# Patient Record
Sex: Female | Born: 1955 | Race: Black or African American | Hispanic: No | Marital: Married | Smoking: Never smoker
Health system: Southern US, Community
[De-identification: ages and names within clinical notes are randomized; demographics above are authoritative.]

## PROBLEM LIST (undated history)

## (undated) HISTORY — PX: FOOT SURGERY: SHX648

## (undated) HISTORY — PX: CARPAL TUNNEL RELEASE: SHX101

## (undated) HISTORY — PX: SHOULDER SURGERY: SHX246

---

## 2013-04-08 ENCOUNTER — Other Ambulatory Visit (HOSPITAL_COMMUNITY)
Admission: RE | Admit: 2013-04-08 | Discharge: 2013-04-08 | Disposition: A | Discharge status: Home / Self Care | Payer: Medicare PPO | Source: Ambulatory Visit | Attending: Internal Medicine | Admitting: Internal Medicine

## 2013-04-08 DIAGNOSIS — Z124 Encounter for screening for malignant neoplasm of cervix: Secondary | ICD-10-CM | POA: Insufficient documentation

## 2013-04-08 DIAGNOSIS — Z1151 Encounter for screening for human papillomavirus (HPV): Secondary | ICD-10-CM | POA: Insufficient documentation

## 2013-04-12 ENCOUNTER — Other Ambulatory Visit: Payer: Self-pay

## 2013-04-12 DIAGNOSIS — Z1231 Encounter for screening mammogram for malignant neoplasm of breast: Secondary | ICD-10-CM

## 2013-04-22 ENCOUNTER — Ambulatory Visit: Payer: Self-pay

## 2013-04-28 ENCOUNTER — Ambulatory Visit
Admission: RE | Admit: 2013-04-28 | Discharge: 2013-04-28 | Disposition: A | Discharge status: Home / Self Care | Payer: Medicare PPO | Source: Ambulatory Visit

## 2013-04-28 DIAGNOSIS — Z1231 Encounter for screening mammogram for malignant neoplasm of breast: Secondary | ICD-10-CM

## 2015-03-20 DIAGNOSIS — R011 Cardiac murmur, unspecified: Secondary | ICD-10-CM | POA: Diagnosis not present

## 2015-04-17 DIAGNOSIS — Z1389 Encounter for screening for other disorder: Secondary | ICD-10-CM | POA: Diagnosis not present

## 2015-04-17 DIAGNOSIS — E785 Hyperlipidemia, unspecified: Secondary | ICD-10-CM | POA: Diagnosis not present

## 2015-04-17 DIAGNOSIS — Z0000 Encounter for general adult medical examination without abnormal findings: Secondary | ICD-10-CM | POA: Diagnosis not present

## 2015-04-17 DIAGNOSIS — E890 Postprocedural hypothyroidism: Secondary | ICD-10-CM | POA: Diagnosis not present

## 2015-04-17 DIAGNOSIS — G5601 Carpal tunnel syndrome, right upper limb: Secondary | ICD-10-CM | POA: Diagnosis not present

## 2015-04-17 DIAGNOSIS — Z7984 Long term (current) use of oral hypoglycemic drugs: Secondary | ICD-10-CM | POA: Diagnosis not present

## 2015-04-17 DIAGNOSIS — I6930 Unspecified sequelae of cerebral infarction: Secondary | ICD-10-CM | POA: Diagnosis not present

## 2015-04-17 DIAGNOSIS — E113299 Type 2 diabetes mellitus with mild nonproliferative diabetic retinopathy without macular edema, unspecified eye: Secondary | ICD-10-CM | POA: Diagnosis not present

## 2015-04-17 DIAGNOSIS — D509 Iron deficiency anemia, unspecified: Secondary | ICD-10-CM | POA: Diagnosis not present

## 2015-08-11 DIAGNOSIS — E119 Type 2 diabetes mellitus without complications: Secondary | ICD-10-CM | POA: Diagnosis not present

## 2015-08-11 DIAGNOSIS — Z7984 Long term (current) use of oral hypoglycemic drugs: Secondary | ICD-10-CM | POA: Diagnosis not present

## 2015-08-11 DIAGNOSIS — H52223 Regular astigmatism, bilateral: Secondary | ICD-10-CM | POA: Diagnosis not present

## 2015-08-11 DIAGNOSIS — H5213 Myopia, bilateral: Secondary | ICD-10-CM | POA: Diagnosis not present

## 2015-08-11 DIAGNOSIS — H524 Presbyopia: Secondary | ICD-10-CM | POA: Diagnosis not present

## 2015-08-31 ENCOUNTER — Encounter (INDEPENDENT_AMBULATORY_CARE_PROVIDER_SITE_OTHER): Payer: Commercial Managed Care - HMO | Admitting: Ophthalmology

## 2015-09-21 ENCOUNTER — Encounter (INDEPENDENT_AMBULATORY_CARE_PROVIDER_SITE_OTHER): Payer: Commercial Managed Care - HMO | Admitting: Ophthalmology

## 2015-10-12 ENCOUNTER — Encounter (INDEPENDENT_AMBULATORY_CARE_PROVIDER_SITE_OTHER): Payer: Commercial Managed Care - HMO | Admitting: Ophthalmology

## 2015-11-15 ENCOUNTER — Encounter (INDEPENDENT_AMBULATORY_CARE_PROVIDER_SITE_OTHER): Payer: Commercial Managed Care - HMO | Admitting: Ophthalmology

## 2015-11-15 DIAGNOSIS — H43813 Vitreous degeneration, bilateral: Secondary | ICD-10-CM | POA: Diagnosis not present

## 2015-11-15 DIAGNOSIS — H2513 Age-related nuclear cataract, bilateral: Secondary | ICD-10-CM | POA: Diagnosis not present

## 2015-11-15 DIAGNOSIS — E11311 Type 2 diabetes mellitus with unspecified diabetic retinopathy with macular edema: Secondary | ICD-10-CM

## 2015-11-15 DIAGNOSIS — E113313 Type 2 diabetes mellitus with moderate nonproliferative diabetic retinopathy with macular edema, bilateral: Secondary | ICD-10-CM | POA: Diagnosis not present

## 2015-12-11 ENCOUNTER — Encounter (INDEPENDENT_AMBULATORY_CARE_PROVIDER_SITE_OTHER): Payer: Commercial Managed Care - HMO | Admitting: Ophthalmology

## 2015-12-11 DIAGNOSIS — E11311 Type 2 diabetes mellitus with unspecified diabetic retinopathy with macular edema: Secondary | ICD-10-CM

## 2015-12-11 DIAGNOSIS — E113313 Type 2 diabetes mellitus with moderate nonproliferative diabetic retinopathy with macular edema, bilateral: Secondary | ICD-10-CM | POA: Diagnosis not present

## 2015-12-11 DIAGNOSIS — H43813 Vitreous degeneration, bilateral: Secondary | ICD-10-CM | POA: Diagnosis not present

## 2015-12-13 ENCOUNTER — Encounter (INDEPENDENT_AMBULATORY_CARE_PROVIDER_SITE_OTHER): Payer: Commercial Managed Care - HMO | Admitting: Ophthalmology

## 2015-12-27 DIAGNOSIS — Z23 Encounter for immunization: Secondary | ICD-10-CM | POA: Diagnosis not present

## 2015-12-27 DIAGNOSIS — E1142 Type 2 diabetes mellitus with diabetic polyneuropathy: Secondary | ICD-10-CM | POA: Diagnosis not present

## 2015-12-27 DIAGNOSIS — Z794 Long term (current) use of insulin: Secondary | ICD-10-CM | POA: Diagnosis not present

## 2015-12-27 DIAGNOSIS — G5601 Carpal tunnel syndrome, right upper limb: Secondary | ICD-10-CM | POA: Diagnosis not present

## 2015-12-27 DIAGNOSIS — E113299 Type 2 diabetes mellitus with mild nonproliferative diabetic retinopathy without macular edema, unspecified eye: Secondary | ICD-10-CM | POA: Diagnosis not present

## 2015-12-27 DIAGNOSIS — E785 Hyperlipidemia, unspecified: Secondary | ICD-10-CM | POA: Diagnosis not present

## 2015-12-27 DIAGNOSIS — E1165 Type 2 diabetes mellitus with hyperglycemia: Secondary | ICD-10-CM | POA: Diagnosis not present

## 2015-12-27 DIAGNOSIS — I6930 Unspecified sequelae of cerebral infarction: Secondary | ICD-10-CM | POA: Diagnosis not present

## 2016-01-08 ENCOUNTER — Encounter (INDEPENDENT_AMBULATORY_CARE_PROVIDER_SITE_OTHER): Payer: Commercial Managed Care - HMO | Admitting: Ophthalmology

## 2016-01-08 DIAGNOSIS — E11311 Type 2 diabetes mellitus with unspecified diabetic retinopathy with macular edema: Secondary | ICD-10-CM | POA: Diagnosis not present

## 2016-01-08 DIAGNOSIS — H2513 Age-related nuclear cataract, bilateral: Secondary | ICD-10-CM

## 2016-01-08 DIAGNOSIS — E113313 Type 2 diabetes mellitus with moderate nonproliferative diabetic retinopathy with macular edema, bilateral: Secondary | ICD-10-CM | POA: Diagnosis not present

## 2016-01-08 DIAGNOSIS — H43813 Vitreous degeneration, bilateral: Secondary | ICD-10-CM | POA: Diagnosis not present

## 2016-01-25 ENCOUNTER — Other Ambulatory Visit: Payer: Self-pay | Admitting: Internal Medicine

## 2016-01-25 DIAGNOSIS — Z1231 Encounter for screening mammogram for malignant neoplasm of breast: Secondary | ICD-10-CM

## 2016-02-01 ENCOUNTER — Ambulatory Visit
Admission: RE | Admit: 2016-02-01 | Discharge: 2016-02-01 | Disposition: A | Discharge status: Home / Self Care | Payer: Medicare PPO | Source: Ambulatory Visit | Attending: Internal Medicine | Admitting: Internal Medicine

## 2016-02-01 DIAGNOSIS — Z1231 Encounter for screening mammogram for malignant neoplasm of breast: Secondary | ICD-10-CM

## 2016-02-05 ENCOUNTER — Encounter (INDEPENDENT_AMBULATORY_CARE_PROVIDER_SITE_OTHER): Payer: Commercial Managed Care - HMO | Admitting: Ophthalmology

## 2016-02-05 DIAGNOSIS — H43813 Vitreous degeneration, bilateral: Secondary | ICD-10-CM | POA: Diagnosis not present

## 2016-02-05 DIAGNOSIS — H2513 Age-related nuclear cataract, bilateral: Secondary | ICD-10-CM

## 2016-02-05 DIAGNOSIS — E11311 Type 2 diabetes mellitus with unspecified diabetic retinopathy with macular edema: Secondary | ICD-10-CM | POA: Diagnosis not present

## 2016-02-05 DIAGNOSIS — E113313 Type 2 diabetes mellitus with moderate nonproliferative diabetic retinopathy with macular edema, bilateral: Secondary | ICD-10-CM

## 2016-03-04 ENCOUNTER — Encounter (INDEPENDENT_AMBULATORY_CARE_PROVIDER_SITE_OTHER): Payer: Medicare HMO | Admitting: Ophthalmology

## 2016-03-04 DIAGNOSIS — E113313 Type 2 diabetes mellitus with moderate nonproliferative diabetic retinopathy with macular edema, bilateral: Secondary | ICD-10-CM | POA: Diagnosis not present

## 2016-03-04 DIAGNOSIS — E11311 Type 2 diabetes mellitus with unspecified diabetic retinopathy with macular edema: Secondary | ICD-10-CM

## 2016-03-04 DIAGNOSIS — H43813 Vitreous degeneration, bilateral: Secondary | ICD-10-CM

## 2016-03-04 DIAGNOSIS — H2513 Age-related nuclear cataract, bilateral: Secondary | ICD-10-CM | POA: Diagnosis not present

## 2016-04-01 ENCOUNTER — Encounter (INDEPENDENT_AMBULATORY_CARE_PROVIDER_SITE_OTHER): Payer: Medicare HMO | Admitting: Ophthalmology

## 2016-04-12 ENCOUNTER — Encounter (INDEPENDENT_AMBULATORY_CARE_PROVIDER_SITE_OTHER): Payer: Medicare HMO | Admitting: Ophthalmology

## 2016-04-12 DIAGNOSIS — E11311 Type 2 diabetes mellitus with unspecified diabetic retinopathy with macular edema: Secondary | ICD-10-CM

## 2016-04-12 DIAGNOSIS — E113313 Type 2 diabetes mellitus with moderate nonproliferative diabetic retinopathy with macular edema, bilateral: Secondary | ICD-10-CM

## 2016-04-12 DIAGNOSIS — H2513 Age-related nuclear cataract, bilateral: Secondary | ICD-10-CM | POA: Diagnosis not present

## 2016-04-12 DIAGNOSIS — H43813 Vitreous degeneration, bilateral: Secondary | ICD-10-CM

## 2016-04-23 DIAGNOSIS — E113299 Type 2 diabetes mellitus with mild nonproliferative diabetic retinopathy without macular edema, unspecified eye: Secondary | ICD-10-CM | POA: Diagnosis not present

## 2016-04-23 DIAGNOSIS — E1142 Type 2 diabetes mellitus with diabetic polyneuropathy: Secondary | ICD-10-CM | POA: Diagnosis not present

## 2016-04-23 DIAGNOSIS — Z01419 Encounter for gynecological examination (general) (routine) without abnormal findings: Secondary | ICD-10-CM | POA: Diagnosis not present

## 2016-04-23 DIAGNOSIS — E785 Hyperlipidemia, unspecified: Secondary | ICD-10-CM | POA: Diagnosis not present

## 2016-04-23 DIAGNOSIS — Z1231 Encounter for screening mammogram for malignant neoplasm of breast: Secondary | ICD-10-CM | POA: Diagnosis not present

## 2016-04-23 DIAGNOSIS — E890 Postprocedural hypothyroidism: Secondary | ICD-10-CM | POA: Diagnosis not present

## 2016-04-23 DIAGNOSIS — I6930 Unspecified sequelae of cerebral infarction: Secondary | ICD-10-CM | POA: Diagnosis not present

## 2016-04-23 DIAGNOSIS — G5601 Carpal tunnel syndrome, right upper limb: Secondary | ICD-10-CM | POA: Diagnosis not present

## 2016-04-23 DIAGNOSIS — Z0000 Encounter for general adult medical examination without abnormal findings: Secondary | ICD-10-CM | POA: Diagnosis not present

## 2016-04-23 DIAGNOSIS — Z7984 Long term (current) use of oral hypoglycemic drugs: Secondary | ICD-10-CM | POA: Diagnosis not present

## 2016-04-23 DIAGNOSIS — E611 Iron deficiency: Secondary | ICD-10-CM | POA: Diagnosis not present

## 2016-05-09 ENCOUNTER — Encounter (INDEPENDENT_AMBULATORY_CARE_PROVIDER_SITE_OTHER): Payer: Medicare HMO | Admitting: Ophthalmology

## 2016-05-09 DIAGNOSIS — H43813 Vitreous degeneration, bilateral: Secondary | ICD-10-CM | POA: Diagnosis not present

## 2016-05-09 DIAGNOSIS — E11311 Type 2 diabetes mellitus with unspecified diabetic retinopathy with macular edema: Secondary | ICD-10-CM

## 2016-05-09 DIAGNOSIS — H2513 Age-related nuclear cataract, bilateral: Secondary | ICD-10-CM | POA: Diagnosis not present

## 2016-05-09 DIAGNOSIS — E113313 Type 2 diabetes mellitus with moderate nonproliferative diabetic retinopathy with macular edema, bilateral: Secondary | ICD-10-CM

## 2016-06-06 ENCOUNTER — Encounter (INDEPENDENT_AMBULATORY_CARE_PROVIDER_SITE_OTHER): Payer: Medicare HMO | Admitting: Ophthalmology

## 2016-06-06 DIAGNOSIS — E11311 Type 2 diabetes mellitus with unspecified diabetic retinopathy with macular edema: Secondary | ICD-10-CM | POA: Diagnosis not present

## 2016-06-06 DIAGNOSIS — E113313 Type 2 diabetes mellitus with moderate nonproliferative diabetic retinopathy with macular edema, bilateral: Secondary | ICD-10-CM | POA: Diagnosis not present

## 2016-06-06 DIAGNOSIS — H43813 Vitreous degeneration, bilateral: Secondary | ICD-10-CM

## 2016-07-02 ENCOUNTER — Encounter (INDEPENDENT_AMBULATORY_CARE_PROVIDER_SITE_OTHER): Payer: Medicare HMO | Admitting: Ophthalmology

## 2016-07-02 DIAGNOSIS — H2513 Age-related nuclear cataract, bilateral: Secondary | ICD-10-CM

## 2016-07-02 DIAGNOSIS — E11311 Type 2 diabetes mellitus with unspecified diabetic retinopathy with macular edema: Secondary | ICD-10-CM | POA: Diagnosis not present

## 2016-07-02 DIAGNOSIS — H43813 Vitreous degeneration, bilateral: Secondary | ICD-10-CM | POA: Diagnosis not present

## 2016-07-02 DIAGNOSIS — E113313 Type 2 diabetes mellitus with moderate nonproliferative diabetic retinopathy with macular edema, bilateral: Secondary | ICD-10-CM | POA: Diagnosis not present

## 2016-07-18 DIAGNOSIS — Z7984 Long term (current) use of oral hypoglycemic drugs: Secondary | ICD-10-CM | POA: Diagnosis not present

## 2016-07-18 DIAGNOSIS — E1165 Type 2 diabetes mellitus with hyperglycemia: Secondary | ICD-10-CM | POA: Diagnosis not present

## 2016-07-18 DIAGNOSIS — E113299 Type 2 diabetes mellitus with mild nonproliferative diabetic retinopathy without macular edema, unspecified eye: Secondary | ICD-10-CM | POA: Diagnosis not present

## 2016-07-23 ENCOUNTER — Other Ambulatory Visit (INDEPENDENT_AMBULATORY_CARE_PROVIDER_SITE_OTHER): Payer: Medicare HMO | Admitting: Ophthalmology

## 2016-07-23 DIAGNOSIS — E11311 Type 2 diabetes mellitus with unspecified diabetic retinopathy with macular edema: Secondary | ICD-10-CM

## 2016-07-23 DIAGNOSIS — E113311 Type 2 diabetes mellitus with moderate nonproliferative diabetic retinopathy with macular edema, right eye: Secondary | ICD-10-CM | POA: Diagnosis not present

## 2016-07-30 ENCOUNTER — Encounter (INDEPENDENT_AMBULATORY_CARE_PROVIDER_SITE_OTHER): Payer: Medicare HMO | Admitting: Ophthalmology

## 2016-07-30 DIAGNOSIS — E113313 Type 2 diabetes mellitus with moderate nonproliferative diabetic retinopathy with macular edema, bilateral: Secondary | ICD-10-CM | POA: Diagnosis not present

## 2016-07-30 DIAGNOSIS — H43813 Vitreous degeneration, bilateral: Secondary | ICD-10-CM

## 2016-07-30 DIAGNOSIS — E11311 Type 2 diabetes mellitus with unspecified diabetic retinopathy with macular edema: Secondary | ICD-10-CM | POA: Diagnosis not present

## 2016-07-30 DIAGNOSIS — H2513 Age-related nuclear cataract, bilateral: Secondary | ICD-10-CM

## 2016-08-09 ENCOUNTER — Other Ambulatory Visit (INDEPENDENT_AMBULATORY_CARE_PROVIDER_SITE_OTHER): Payer: Medicare HMO | Admitting: Ophthalmology

## 2016-08-09 DIAGNOSIS — E11311 Type 2 diabetes mellitus with unspecified diabetic retinopathy with macular edema: Secondary | ICD-10-CM | POA: Diagnosis not present

## 2016-08-09 DIAGNOSIS — E113312 Type 2 diabetes mellitus with moderate nonproliferative diabetic retinopathy with macular edema, left eye: Secondary | ICD-10-CM

## 2016-08-27 ENCOUNTER — Encounter (INDEPENDENT_AMBULATORY_CARE_PROVIDER_SITE_OTHER): Payer: Medicare HMO | Admitting: Ophthalmology

## 2016-09-05 ENCOUNTER — Encounter (INDEPENDENT_AMBULATORY_CARE_PROVIDER_SITE_OTHER): Payer: Medicare HMO | Admitting: Ophthalmology

## 2016-09-05 DIAGNOSIS — H43813 Vitreous degeneration, bilateral: Secondary | ICD-10-CM | POA: Diagnosis not present

## 2016-09-05 DIAGNOSIS — E11311 Type 2 diabetes mellitus with unspecified diabetic retinopathy with macular edema: Secondary | ICD-10-CM

## 2016-09-05 DIAGNOSIS — E113313 Type 2 diabetes mellitus with moderate nonproliferative diabetic retinopathy with macular edema, bilateral: Secondary | ICD-10-CM | POA: Diagnosis not present

## 2016-10-03 ENCOUNTER — Encounter (INDEPENDENT_AMBULATORY_CARE_PROVIDER_SITE_OTHER): Payer: Medicare HMO | Admitting: Ophthalmology

## 2016-10-03 DIAGNOSIS — H43813 Vitreous degeneration, bilateral: Secondary | ICD-10-CM

## 2016-10-03 DIAGNOSIS — E11311 Type 2 diabetes mellitus with unspecified diabetic retinopathy with macular edema: Secondary | ICD-10-CM

## 2016-10-03 DIAGNOSIS — E113313 Type 2 diabetes mellitus with moderate nonproliferative diabetic retinopathy with macular edema, bilateral: Secondary | ICD-10-CM

## 2016-10-03 DIAGNOSIS — H2513 Age-related nuclear cataract, bilateral: Secondary | ICD-10-CM | POA: Diagnosis not present

## 2016-10-31 ENCOUNTER — Encounter (INDEPENDENT_AMBULATORY_CARE_PROVIDER_SITE_OTHER): Payer: Medicare HMO | Admitting: Ophthalmology

## 2016-10-31 DIAGNOSIS — E11311 Type 2 diabetes mellitus with unspecified diabetic retinopathy with macular edema: Secondary | ICD-10-CM

## 2016-10-31 DIAGNOSIS — E113313 Type 2 diabetes mellitus with moderate nonproliferative diabetic retinopathy with macular edema, bilateral: Secondary | ICD-10-CM | POA: Diagnosis not present

## 2016-10-31 DIAGNOSIS — H43813 Vitreous degeneration, bilateral: Secondary | ICD-10-CM

## 2016-11-25 DIAGNOSIS — E1142 Type 2 diabetes mellitus with diabetic polyneuropathy: Secondary | ICD-10-CM | POA: Diagnosis not present

## 2016-11-25 DIAGNOSIS — Z23 Encounter for immunization: Secondary | ICD-10-CM | POA: Diagnosis not present

## 2016-11-25 DIAGNOSIS — E113293 Type 2 diabetes mellitus with mild nonproliferative diabetic retinopathy without macular edema, bilateral: Secondary | ICD-10-CM | POA: Diagnosis not present

## 2016-11-25 DIAGNOSIS — E890 Postprocedural hypothyroidism: Secondary | ICD-10-CM | POA: Diagnosis not present

## 2016-11-25 DIAGNOSIS — Z7984 Long term (current) use of oral hypoglycemic drugs: Secondary | ICD-10-CM | POA: Diagnosis not present

## 2016-11-25 DIAGNOSIS — E1165 Type 2 diabetes mellitus with hyperglycemia: Secondary | ICD-10-CM | POA: Diagnosis not present

## 2016-11-25 DIAGNOSIS — I6930 Unspecified sequelae of cerebral infarction: Secondary | ICD-10-CM | POA: Diagnosis not present

## 2016-11-25 DIAGNOSIS — E785 Hyperlipidemia, unspecified: Secondary | ICD-10-CM | POA: Diagnosis not present

## 2016-11-25 DIAGNOSIS — Z6828 Body mass index (BMI) 28.0-28.9, adult: Secondary | ICD-10-CM | POA: Diagnosis not present

## 2016-11-28 ENCOUNTER — Encounter (INDEPENDENT_AMBULATORY_CARE_PROVIDER_SITE_OTHER): Payer: Medicare HMO | Admitting: Ophthalmology

## 2016-11-28 DIAGNOSIS — E11311 Type 2 diabetes mellitus with unspecified diabetic retinopathy with macular edema: Secondary | ICD-10-CM | POA: Diagnosis not present

## 2016-11-28 DIAGNOSIS — H2513 Age-related nuclear cataract, bilateral: Secondary | ICD-10-CM

## 2016-11-28 DIAGNOSIS — H43813 Vitreous degeneration, bilateral: Secondary | ICD-10-CM | POA: Diagnosis not present

## 2016-11-28 DIAGNOSIS — E113313 Type 2 diabetes mellitus with moderate nonproliferative diabetic retinopathy with macular edema, bilateral: Secondary | ICD-10-CM | POA: Diagnosis not present

## 2016-12-26 ENCOUNTER — Encounter (INDEPENDENT_AMBULATORY_CARE_PROVIDER_SITE_OTHER): Payer: Medicare HMO | Admitting: Ophthalmology

## 2016-12-26 DIAGNOSIS — E11311 Type 2 diabetes mellitus with unspecified diabetic retinopathy with macular edema: Secondary | ICD-10-CM | POA: Diagnosis not present

## 2016-12-26 DIAGNOSIS — E113313 Type 2 diabetes mellitus with moderate nonproliferative diabetic retinopathy with macular edema, bilateral: Secondary | ICD-10-CM

## 2016-12-26 DIAGNOSIS — H43813 Vitreous degeneration, bilateral: Secondary | ICD-10-CM

## 2017-01-28 ENCOUNTER — Encounter (INDEPENDENT_AMBULATORY_CARE_PROVIDER_SITE_OTHER): Payer: Medicare HMO | Admitting: Ophthalmology

## 2017-01-28 DIAGNOSIS — E11311 Type 2 diabetes mellitus with unspecified diabetic retinopathy with macular edema: Secondary | ICD-10-CM | POA: Diagnosis not present

## 2017-01-28 DIAGNOSIS — E113313 Type 2 diabetes mellitus with moderate nonproliferative diabetic retinopathy with macular edema, bilateral: Secondary | ICD-10-CM | POA: Diagnosis not present

## 2017-01-28 DIAGNOSIS — H43813 Vitreous degeneration, bilateral: Secondary | ICD-10-CM | POA: Diagnosis not present

## 2017-01-28 DIAGNOSIS — H2513 Age-related nuclear cataract, bilateral: Secondary | ICD-10-CM | POA: Diagnosis not present

## 2017-03-04 ENCOUNTER — Encounter (INDEPENDENT_AMBULATORY_CARE_PROVIDER_SITE_OTHER): Payer: Medicare HMO | Admitting: Ophthalmology

## 2017-03-04 DIAGNOSIS — E11311 Type 2 diabetes mellitus with unspecified diabetic retinopathy with macular edema: Secondary | ICD-10-CM | POA: Diagnosis not present

## 2017-03-04 DIAGNOSIS — H2513 Age-related nuclear cataract, bilateral: Secondary | ICD-10-CM | POA: Diagnosis not present

## 2017-03-04 DIAGNOSIS — E113313 Type 2 diabetes mellitus with moderate nonproliferative diabetic retinopathy with macular edema, bilateral: Secondary | ICD-10-CM | POA: Diagnosis not present

## 2017-03-04 DIAGNOSIS — H43813 Vitreous degeneration, bilateral: Secondary | ICD-10-CM | POA: Diagnosis not present

## 2017-04-01 ENCOUNTER — Encounter (INDEPENDENT_AMBULATORY_CARE_PROVIDER_SITE_OTHER): Payer: Medicare HMO | Admitting: Ophthalmology

## 2017-04-01 DIAGNOSIS — E113313 Type 2 diabetes mellitus with moderate nonproliferative diabetic retinopathy with macular edema, bilateral: Secondary | ICD-10-CM | POA: Diagnosis not present

## 2017-04-01 DIAGNOSIS — E11311 Type 2 diabetes mellitus with unspecified diabetic retinopathy with macular edema: Secondary | ICD-10-CM | POA: Diagnosis not present

## 2017-04-01 DIAGNOSIS — H43813 Vitreous degeneration, bilateral: Secondary | ICD-10-CM

## 2017-04-01 DIAGNOSIS — H2513 Age-related nuclear cataract, bilateral: Secondary | ICD-10-CM | POA: Diagnosis not present

## 2017-04-24 ENCOUNTER — Other Ambulatory Visit (HOSPITAL_COMMUNITY)
Admission: RE | Admit: 2017-04-24 | Discharge: 2017-04-24 | Disposition: A | Discharge status: Home / Self Care | Payer: Medicare HMO | Source: Ambulatory Visit | Attending: Internal Medicine | Admitting: Internal Medicine

## 2017-04-24 ENCOUNTER — Other Ambulatory Visit: Payer: Self-pay | Admitting: Internal Medicine

## 2017-04-24 DIAGNOSIS — I6930 Unspecified sequelae of cerebral infarction: Secondary | ICD-10-CM | POA: Diagnosis not present

## 2017-04-24 DIAGNOSIS — G5601 Carpal tunnel syndrome, right upper limb: Secondary | ICD-10-CM | POA: Diagnosis not present

## 2017-04-24 DIAGNOSIS — Z7984 Long term (current) use of oral hypoglycemic drugs: Secondary | ICD-10-CM | POA: Diagnosis not present

## 2017-04-24 DIAGNOSIS — Z124 Encounter for screening for malignant neoplasm of cervix: Secondary | ICD-10-CM | POA: Diagnosis not present

## 2017-04-24 DIAGNOSIS — Z0000 Encounter for general adult medical examination without abnormal findings: Secondary | ICD-10-CM | POA: Diagnosis not present

## 2017-04-24 DIAGNOSIS — Z1389 Encounter for screening for other disorder: Secondary | ICD-10-CM | POA: Diagnosis not present

## 2017-04-24 DIAGNOSIS — E1142 Type 2 diabetes mellitus with diabetic polyneuropathy: Secondary | ICD-10-CM | POA: Diagnosis not present

## 2017-04-24 DIAGNOSIS — E113299 Type 2 diabetes mellitus with mild nonproliferative diabetic retinopathy without macular edema, unspecified eye: Secondary | ICD-10-CM | POA: Diagnosis not present

## 2017-04-24 DIAGNOSIS — E785 Hyperlipidemia, unspecified: Secondary | ICD-10-CM | POA: Diagnosis not present

## 2017-04-24 DIAGNOSIS — Z01419 Encounter for gynecological examination (general) (routine) without abnormal findings: Secondary | ICD-10-CM | POA: Diagnosis not present

## 2017-04-24 DIAGNOSIS — E890 Postprocedural hypothyroidism: Secondary | ICD-10-CM | POA: Diagnosis not present

## 2017-04-29 ENCOUNTER — Other Ambulatory Visit: Payer: Self-pay | Admitting: *Deleted

## 2017-04-29 LAB — CYTOLOGY - PAP
Chlamydia: NEGATIVE
Diagnosis: NEGATIVE
Neisseria Gonorrhea: NEGATIVE

## 2017-04-29 NOTE — Patient Outreach (Addendum)
[place] [facility]) Care Management  04/29/2017  TAHLIYAH ANAGNOS 06-27-1955 [id number]   Telephone screening call  Referral received 04/28/17 Referral from : PCP office Referral reason : Social worker to see if patient can qualify for medicare/medicaid . Needs assistance with hearing aids.  Unsuccessful outreach call to patient, able to leave a HIPAA compliant  message for return call   Plan If no return call today, will schedule 2nd attempt call in the next business day.   Joylene Draft, RN, [place] Management Coordinator  [phone]- Mobile [phone]- Toll Free Main Office

## 2017-04-30 ENCOUNTER — Other Ambulatory Visit: Payer: Self-pay | Admitting: *Deleted

## 2017-04-30 ENCOUNTER — Encounter (INDEPENDENT_AMBULATORY_CARE_PROVIDER_SITE_OTHER): Payer: Medicare HMO | Admitting: Ophthalmology

## 2017-04-30 ENCOUNTER — Encounter: Payer: Self-pay | Admitting: *Deleted

## 2017-04-30 DIAGNOSIS — H43813 Vitreous degeneration, bilateral: Secondary | ICD-10-CM

## 2017-04-30 DIAGNOSIS — H35033 Hypertensive retinopathy, bilateral: Secondary | ICD-10-CM

## 2017-04-30 DIAGNOSIS — E11311 Type 2 diabetes mellitus with unspecified diabetic retinopathy with macular edema: Secondary | ICD-10-CM

## 2017-04-30 DIAGNOSIS — H2513 Age-related nuclear cataract, bilateral: Secondary | ICD-10-CM | POA: Diagnosis not present

## 2017-04-30 DIAGNOSIS — I10 Essential (primary) hypertension: Secondary | ICD-10-CM | POA: Diagnosis not present

## 2017-04-30 DIAGNOSIS — E113313 Type 2 diabetes mellitus with moderate nonproliferative diabetic retinopathy with macular edema, bilateral: Secondary | ICD-10-CM | POA: Diagnosis not present

## 2017-04-30 NOTE — Patient Outreach (Signed)
[place] [facility]) Care Management  04/30/2017  Colleen Carroll [birth year] [id number]   Telephone screening call #2  Referral received 04/28/17 Referral from : PCP office Referral reason : Social worker to see if patient can qualify for medicare/medicaid . Needs assistance with hearing aids.  Unsuccessful outreach call to patient, able to leave a HIPAA compliant  message for return call   Plan RN will send unsuccessful outreach letter and schedule outreach call in the next 10 days per workflow.   Joylene Draft, RN, [place] Management Coordinator  [phone]- Mobile [phone]- Toll Free Main Office

## 2017-05-07 DIAGNOSIS — E1142 Type 2 diabetes mellitus with diabetic polyneuropathy: Secondary | ICD-10-CM | POA: Diagnosis not present

## 2017-05-13 ENCOUNTER — Other Ambulatory Visit: Payer: Self-pay | Admitting: *Deleted

## 2017-05-13 NOTE — Patient Outreach (Addendum)
[facility] [facility]) Care Management  05/13/2017  TRINIA GEORGI 1955/12/12 [id number]   Referral received 04/28/17 Referral from : PCP office Referral reason : Social worker to see if patient can qualify for medicare/medicaid . Needs assistance with hearing aids.  Successful outreach call to patient, HIPAA verified, discussed reason for the call and [facility] care management services, explained referral we received regarding need with  assistance as to whether she would qualify for medicaid.  Patient denies need for services or assistance at this time. Reports she is aware that she is not eligible for medicaid services.  Patient denies any other needs or concerns regarding hearing aid assistance  at this time or answering further assessment questions.   Plan  Will notify CMA of case closure, patient has declined need for  services at this time.    Joylene Draft, RN, [place] Management Coordinator  [phone]- Mobile [phone]- Toll Free Main Office

## 2017-05-15 DIAGNOSIS — E113299 Type 2 diabetes mellitus with mild nonproliferative diabetic retinopathy without macular edema, unspecified eye: Secondary | ICD-10-CM | POA: Diagnosis not present

## 2017-05-21 DIAGNOSIS — E785 Hyperlipidemia, unspecified: Secondary | ICD-10-CM | POA: Diagnosis not present

## 2017-05-21 DIAGNOSIS — E890 Postprocedural hypothyroidism: Secondary | ICD-10-CM | POA: Diagnosis not present

## 2017-05-21 DIAGNOSIS — E113299 Type 2 diabetes mellitus with mild nonproliferative diabetic retinopathy without macular edema, unspecified eye: Secondary | ICD-10-CM | POA: Diagnosis not present

## 2017-05-21 DIAGNOSIS — E1142 Type 2 diabetes mellitus with diabetic polyneuropathy: Secondary | ICD-10-CM | POA: Diagnosis not present

## 2017-05-21 DIAGNOSIS — Z7984 Long term (current) use of oral hypoglycemic drugs: Secondary | ICD-10-CM | POA: Diagnosis not present

## 2017-05-28 ENCOUNTER — Encounter (INDEPENDENT_AMBULATORY_CARE_PROVIDER_SITE_OTHER): Payer: Medicare HMO | Admitting: Ophthalmology

## 2017-05-28 DIAGNOSIS — E113313 Type 2 diabetes mellitus with moderate nonproliferative diabetic retinopathy with macular edema, bilateral: Secondary | ICD-10-CM | POA: Diagnosis not present

## 2017-05-28 DIAGNOSIS — H43813 Vitreous degeneration, bilateral: Secondary | ICD-10-CM | POA: Diagnosis not present

## 2017-05-28 DIAGNOSIS — E11311 Type 2 diabetes mellitus with unspecified diabetic retinopathy with macular edema: Secondary | ICD-10-CM | POA: Diagnosis not present

## 2017-05-28 DIAGNOSIS — H2513 Age-related nuclear cataract, bilateral: Secondary | ICD-10-CM

## 2017-06-27 ENCOUNTER — Encounter (INDEPENDENT_AMBULATORY_CARE_PROVIDER_SITE_OTHER): Payer: Medicare HMO | Admitting: Ophthalmology

## 2017-06-27 DIAGNOSIS — E11311 Type 2 diabetes mellitus with unspecified diabetic retinopathy with macular edema: Secondary | ICD-10-CM

## 2017-06-27 DIAGNOSIS — H2513 Age-related nuclear cataract, bilateral: Secondary | ICD-10-CM | POA: Diagnosis not present

## 2017-06-27 DIAGNOSIS — E113313 Type 2 diabetes mellitus with moderate nonproliferative diabetic retinopathy with macular edema, bilateral: Secondary | ICD-10-CM | POA: Diagnosis not present

## 2017-06-27 DIAGNOSIS — H43813 Vitreous degeneration, bilateral: Secondary | ICD-10-CM | POA: Diagnosis not present

## 2017-07-25 ENCOUNTER — Encounter (INDEPENDENT_AMBULATORY_CARE_PROVIDER_SITE_OTHER): Payer: Medicare HMO | Admitting: Ophthalmology

## 2017-07-25 DIAGNOSIS — E113313 Type 2 diabetes mellitus with moderate nonproliferative diabetic retinopathy with macular edema, bilateral: Secondary | ICD-10-CM | POA: Diagnosis not present

## 2017-07-25 DIAGNOSIS — E11311 Type 2 diabetes mellitus with unspecified diabetic retinopathy with macular edema: Secondary | ICD-10-CM

## 2017-07-25 DIAGNOSIS — H43813 Vitreous degeneration, bilateral: Secondary | ICD-10-CM

## 2017-07-25 DIAGNOSIS — H2513 Age-related nuclear cataract, bilateral: Secondary | ICD-10-CM | POA: Diagnosis not present

## 2017-08-22 ENCOUNTER — Encounter (INDEPENDENT_AMBULATORY_CARE_PROVIDER_SITE_OTHER): Payer: Medicare HMO | Admitting: Ophthalmology

## 2017-08-22 DIAGNOSIS — E11311 Type 2 diabetes mellitus with unspecified diabetic retinopathy with macular edema: Secondary | ICD-10-CM

## 2017-08-22 DIAGNOSIS — E113313 Type 2 diabetes mellitus with moderate nonproliferative diabetic retinopathy with macular edema, bilateral: Secondary | ICD-10-CM

## 2017-08-22 DIAGNOSIS — H2513 Age-related nuclear cataract, bilateral: Secondary | ICD-10-CM

## 2017-08-22 DIAGNOSIS — H43813 Vitreous degeneration, bilateral: Secondary | ICD-10-CM

## 2017-09-11 ENCOUNTER — Other Ambulatory Visit: Payer: Self-pay | Admitting: Internal Medicine

## 2017-09-11 DIAGNOSIS — Z1231 Encounter for screening mammogram for malignant neoplasm of breast: Secondary | ICD-10-CM

## 2017-09-19 ENCOUNTER — Encounter (INDEPENDENT_AMBULATORY_CARE_PROVIDER_SITE_OTHER): Payer: Medicare HMO | Admitting: Ophthalmology

## 2017-09-19 DIAGNOSIS — E11311 Type 2 diabetes mellitus with unspecified diabetic retinopathy with macular edema: Secondary | ICD-10-CM | POA: Diagnosis not present

## 2017-09-19 DIAGNOSIS — E113313 Type 2 diabetes mellitus with moderate nonproliferative diabetic retinopathy with macular edema, bilateral: Secondary | ICD-10-CM | POA: Diagnosis not present

## 2017-09-19 DIAGNOSIS — H43813 Vitreous degeneration, bilateral: Secondary | ICD-10-CM

## 2017-10-03 ENCOUNTER — Ambulatory Visit
Admission: RE | Admit: 2017-10-03 | Discharge: 2017-10-03 | Disposition: A | Discharge status: Home / Self Care | Payer: Medicare HMO | Source: Ambulatory Visit | Attending: Internal Medicine | Admitting: Internal Medicine

## 2017-10-03 DIAGNOSIS — Z1231 Encounter for screening mammogram for malignant neoplasm of breast: Secondary | ICD-10-CM

## 2017-10-16 DIAGNOSIS — E113299 Type 2 diabetes mellitus with mild nonproliferative diabetic retinopathy without macular edema, unspecified eye: Secondary | ICD-10-CM | POA: Diagnosis not present

## 2017-10-16 DIAGNOSIS — Z7984 Long term (current) use of oral hypoglycemic drugs: Secondary | ICD-10-CM | POA: Diagnosis not present

## 2017-10-16 DIAGNOSIS — E785 Hyperlipidemia, unspecified: Secondary | ICD-10-CM | POA: Diagnosis not present

## 2017-10-16 DIAGNOSIS — E1142 Type 2 diabetes mellitus with diabetic polyneuropathy: Secondary | ICD-10-CM | POA: Diagnosis not present

## 2017-10-16 DIAGNOSIS — E890 Postprocedural hypothyroidism: Secondary | ICD-10-CM | POA: Diagnosis not present

## 2017-10-17 ENCOUNTER — Encounter (INDEPENDENT_AMBULATORY_CARE_PROVIDER_SITE_OTHER): Payer: Medicare HMO | Admitting: Ophthalmology

## 2017-10-17 DIAGNOSIS — E11311 Type 2 diabetes mellitus with unspecified diabetic retinopathy with macular edema: Secondary | ICD-10-CM

## 2017-10-17 DIAGNOSIS — H2513 Age-related nuclear cataract, bilateral: Secondary | ICD-10-CM

## 2017-10-17 DIAGNOSIS — H43813 Vitreous degeneration, bilateral: Secondary | ICD-10-CM | POA: Diagnosis not present

## 2017-10-17 DIAGNOSIS — E113313 Type 2 diabetes mellitus with moderate nonproliferative diabetic retinopathy with macular edema, bilateral: Secondary | ICD-10-CM

## 2017-10-21 DIAGNOSIS — K210 Gastro-esophageal reflux disease with esophagitis: Secondary | ICD-10-CM | POA: Diagnosis not present

## 2017-10-21 DIAGNOSIS — E113299 Type 2 diabetes mellitus with mild nonproliferative diabetic retinopathy without macular edema, unspecified eye: Secondary | ICD-10-CM | POA: Diagnosis not present

## 2017-10-21 DIAGNOSIS — E113293 Type 2 diabetes mellitus with mild nonproliferative diabetic retinopathy without macular edema, bilateral: Secondary | ICD-10-CM | POA: Diagnosis not present

## 2017-10-21 DIAGNOSIS — E1142 Type 2 diabetes mellitus with diabetic polyneuropathy: Secondary | ICD-10-CM | POA: Diagnosis not present

## 2017-11-12 ENCOUNTER — Emergency Department (HOSPITAL_COMMUNITY): Payer: Medicare HMO

## 2017-11-12 ENCOUNTER — Emergency Department (HOSPITAL_COMMUNITY)
Admission: EM | Admit: 2017-11-12 | Discharge: 2017-11-13 | Disposition: A | Discharge status: Home / Self Care | Payer: Medicare HMO | Attending: Emergency Medicine | Admitting: Emergency Medicine

## 2017-11-12 ENCOUNTER — Encounter (HOSPITAL_COMMUNITY): Payer: Self-pay | Admitting: Family Medicine

## 2017-11-12 DIAGNOSIS — M542 Cervicalgia: Secondary | ICD-10-CM | POA: Diagnosis not present

## 2017-11-12 DIAGNOSIS — M25511 Pain in right shoulder: Secondary | ICD-10-CM

## 2017-11-12 DIAGNOSIS — M25552 Pain in left hip: Secondary | ICD-10-CM | POA: Diagnosis not present

## 2017-11-12 DIAGNOSIS — R52 Pain, unspecified: Secondary | ICD-10-CM | POA: Diagnosis not present

## 2017-11-12 DIAGNOSIS — M545 Low back pain, unspecified: Secondary | ICD-10-CM

## 2017-11-12 DIAGNOSIS — R51 Headache: Secondary | ICD-10-CM | POA: Diagnosis not present

## 2017-11-12 DIAGNOSIS — S4991XA Unspecified injury of right shoulder and upper arm, initial encounter: Secondary | ICD-10-CM | POA: Diagnosis not present

## 2017-11-12 DIAGNOSIS — S3992XA Unspecified injury of lower back, initial encounter: Secondary | ICD-10-CM | POA: Diagnosis not present

## 2017-11-12 DIAGNOSIS — Y9301 Activity, walking, marching and hiking: Secondary | ICD-10-CM | POA: Diagnosis not present

## 2017-11-12 DIAGNOSIS — E041 Nontoxic single thyroid nodule: Secondary | ICD-10-CM | POA: Insufficient documentation

## 2017-11-12 DIAGNOSIS — Y998 Other external cause status: Secondary | ICD-10-CM | POA: Diagnosis not present

## 2017-11-12 DIAGNOSIS — Y92512 Supermarket, store or market as the place of occurrence of the external cause: Secondary | ICD-10-CM | POA: Diagnosis not present

## 2017-11-12 DIAGNOSIS — S79912A Unspecified injury of left hip, initial encounter: Secondary | ICD-10-CM | POA: Diagnosis not present

## 2017-11-12 DIAGNOSIS — W010XXA Fall on same level from slipping, tripping and stumbling without subsequent striking against object, initial encounter: Secondary | ICD-10-CM | POA: Insufficient documentation

## 2017-11-12 DIAGNOSIS — I10 Essential (primary) hypertension: Secondary | ICD-10-CM | POA: Diagnosis not present

## 2017-11-12 DIAGNOSIS — Z8673 Personal history of transient ischemic attack (TIA), and cerebral infarction without residual deficits: Secondary | ICD-10-CM | POA: Diagnosis not present

## 2017-11-12 DIAGNOSIS — S199XXA Unspecified injury of neck, initial encounter: Secondary | ICD-10-CM | POA: Diagnosis not present

## 2017-11-12 DIAGNOSIS — S0990XA Unspecified injury of head, initial encounter: Secondary | ICD-10-CM | POA: Diagnosis not present

## 2017-11-12 DIAGNOSIS — M25551 Pain in right hip: Secondary | ICD-10-CM

## 2017-11-12 DIAGNOSIS — S79911A Unspecified injury of right hip, initial encounter: Secondary | ICD-10-CM | POA: Insufficient documentation

## 2017-11-12 DIAGNOSIS — S299XXA Unspecified injury of thorax, initial encounter: Secondary | ICD-10-CM | POA: Diagnosis not present

## 2017-11-12 DIAGNOSIS — W19XXXA Unspecified fall, initial encounter: Secondary | ICD-10-CM

## 2017-11-12 DIAGNOSIS — M546 Pain in thoracic spine: Secondary | ICD-10-CM | POA: Diagnosis not present

## 2017-11-12 LAB — CBG MONITORING, ED: Glucose-Capillary: 113 mg/dL — ABNORMAL HIGH (ref 70–99)

## 2017-11-12 MED ORDER — HYDROCODONE-ACETAMINOPHEN 5-325 MG PO TABS
1.0000 | ORAL_TABLET | Freq: Once | ORAL | Status: AC
  Administered 2017-11-12: 1 via ORAL
  Filled 2017-11-12: qty 1

## 2017-11-12 NOTE — Discharge Instructions (Signed)
Please see the information and instructions below regarding your visit.  Your diagnoses today include:  1. Fall, initial encounter   2. Acute pain of right shoulder   3. Acute bilateral low back pain without sciatica   4. Bilateral hip pain     Tests performed today include: See side panel of your discharge paperwork for testing performed today. Vital signs are listed at the bottom of these instructions.   Your work-up is very reassuring today.  Your MRI did not show any acute traumatic changes as result of your fall today.  It did show some degenerative changes of the thoracic and lumbar spine, which is expected.  You also incidentally have a thyroid nodule, which will need a follow-up nonemergent ultrasound ordered by your primary care provider.  Medications prescribed:    Take any prescribed medications only as prescribed, and any over the counter medications only as directed on the packaging.  Home care instructions:  Please follow any educational materials contained in this packet.   Please apply Salon PAS patches to the right shoulder. These are over the counter in the pain control aisle.   Follow-up instructions: Please follow-up with your primary care provider in 5-7 days for further evaluation of your symptoms if they are not completely improved.   Please follow up with orthopedic and sports medicine.   Return instructions:  Please return to the Emergency Department if you experience worsening symptoms.  Please return to the emergency department if you develop any worsening pain, uncoordinated gait, changes in weakness or numbness in lower extremities, numbness in groin, or loss of bowel/bladder control.  Please return if you have any other emergent concerns.  Additional Information:   Your vital signs today were: BP (!) 148/91 (BP Location: Left Arm)    Pulse 77    Temp 98.3 F (36.8 C) (Oral)    Resp 14    SpO2 100%  If your blood pressure (BP) was elevated on  multiple readings during this visit above 130 for the top number or above 80 for the bottom number, please have this repeated by your primary care provider within one month. --------------  Thank you for allowing [place] to participate in your care today.

## 2017-11-12 NOTE — ED Provider Notes (Signed)
[place] DEPT Provider Note   CSN: [id number] Arrival date & time: 11/12/17  1425     History   Chief Complaint Chief Complaint  Patient presents with  . Fall    HPI Colleen Carroll is a 62 y.o. female.  HPI  Patient is a 62 year old female with a history of CVA in 2003 presenting for fall.  Patient reports she was at [facility], when she slipped on the floor on a wet spot in front of her.  Patient reports that she did not have any presyncopal sensation, chest pain, shortness of breath, dizziness or lightheadedness before this happened.  Patient reports that she fell onto her right shoulder, right hip and left hip, and hit the back of her head.  Patient reports that subsequently, she is unable to walk, is now unable to lift her feet, and cannot feel anything in her lower extremity's.  Patient denies any saddle anesthesia, loss of bowel bladder control.  Patient denies any diminished functional status of the lower extremities at baseline.  History reviewed. No pertinent past medical history.  There are no active problems to display for this patient.   Past Surgical History:  Procedure Laterality Date  . CARPAL TUNNEL RELEASE    . FOOT SURGERY Bilateral   . SHOULDER SURGERY Right      OB History   None      Home Medications    Prior to Admission medications   Not on File    Family History History reviewed. No pertinent family history.  Social History Social History   Tobacco Use  . Smoking status: Never Smoker  . Smokeless tobacco: Never Used  Substance Use Topics  . Alcohol use: Never    Frequency: Never  . Drug use: Never     Allergies   Patient has no allergy information on record.   Review of Systems Review of Systems  Eyes: Negative for visual disturbance.  Gastrointestinal: Negative for abdominal pain, nausea and vomiting.  Musculoskeletal: Positive for arthralgias, back pain and myalgias.  Skin: Negative for  wound.  Neurological: Positive for weakness and numbness. Negative for dizziness, syncope and light-headedness.  All other systems reviewed and are negative.    Physical Exam Updated Vital Signs BP 133/89 (BP Location: Left Arm)   Pulse 81   Temp 98.3 F (36.8 C) (Oral)   Resp 16   SpO2 99%   Physical Exam  Constitutional: She is oriented to person, place, and time. She appears well-developed and well-nourished. No distress.  HENT:  Head: Normocephalic and atraumatic.  Mouth/Throat: Oropharynx is clear and moist.  Eyes: Pupils are equal, round, and reactive to light. Conjunctivae and EOM are normal.  Neck: Normal range of motion. Neck supple.  Cardiovascular: Normal rate, regular rhythm, S1 normal and S2 normal.  No murmur heard. Pulmonary/Chest: Effort normal and breath sounds normal. She has no wheezes. She has no rales.  Abdominal: Soft. She exhibits no distension. There is no tenderness. There is no guarding.  Musculoskeletal:  Patient with tenderness to palpation of the lateral deltoid.  Patient is able to perform passive range of motion with flexion, extension, abduction, adduction, internal and external rotation of the right upper extremity. Patient has no midline tenderness of cervical, thoracic, or lumbar spine.  Patient does have diffuse paraspinal muscular tenderness.    Lymphadenopathy:    She has no cervical adenopathy.  Neurological: She is alert and oriented to person, place, and time.  Cranial nerves grossly intact.  Strength 5 out of 5 in upper extremities with grip strength. On examination of lower extremities, patient has increased tone in bilateral lower extremities and no flaccidity.  Patient reporting inability to move her lower extremities, and on palpation, patient reporting she cannot feel pressure. Plantar and patellar reflexes are 2+ and equal bilaterally.  No clonus.  Skin: Skin is warm and dry. No rash noted. No erythema.  Psychiatric: She has a normal  mood and affect. Her behavior is normal. Judgment and thought content normal.  Nursing note and vitals reviewed.    ED Treatments / Results  Labs (all labs ordered are listed, but only abnormal results are displayed) Labs Reviewed - No data to display  EKG None  Radiology Dg Lumbar Spine Complete  Result Date: 11/12/2017 CLINICAL DATA:  Pain after slip and fall. EXAM: LUMBAR SPINE - COMPLETE 4+ VIEW COMPARISON:  None. FINDINGS: There are 5 non ribbed lumbar vertebrae in normal lumbar lordosis. No pars defects or listhesis. Slight degenerative disc space narrowing L4-5 and L5-S1. Lower lumbar facet arthrosis L4-5 and L5-S1. No radiographically apparent fracture given limitations due to overlying bowel. 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 No diastasis of the SI joints. 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 no acute osseous appearing abnormality of the lumbar spine. Osteoarthritis of the SI joints bilaterally. Electronically Signed   By: Ashley Royalty M.D.   On: 11/12/2017 18:48   Dg Shoulder Right  Result Date: 11/12/2017 CLINICAL DATA:  Right shoulder pain after fall. EXAM: RIGHT SHOULDER - 2+ VIEW COMPARISON:  None. FINDINGS: There is no evidence of fracture or dislocation. Minimal proliferative disease involving the [facility] joint. Soft tissues are unremarkable. IMPRESSION: No acute injury identified.  Minimal AC joint degenerative disease. Electronically Signed   By: Aletta Edouard M.D.   On: 11/12/2017 17:48   Ct Head Wo Contrast  Result Date: 11/12/2017 CLINICAL DATA:  Fall hitting posterior portion of head. Posterior headache and neck pain. EXAM: CT HEAD WITHOUT CONTRAST CT CERVICAL SPINE WITHOUT CONTRAST TECHNIQUE: Multidetector CT imaging of the head and cervical spine was performed following the standard protocol without intravenous contrast. Multiplanar CT image reconstructions of the cervical spine were also generated. COMPARISON:   None. FINDINGS: CT HEAD FINDINGS Brain: No evidence of acute infarction, hemorrhage, hydrocephalus, extra-axial collection or mass lesion/mass effect. Vascular: No hyperdense vessel or unexpected calcification. Skull: Normal. Negative for fracture or focal lesion. Sinuses/Orbits: No acute finding. Other: None. CT CERVICAL SPINE FINDINGS Alignment: Normal. Skull base and vertebrae: No acute fracture. No primary bone lesion or focal pathologic process. Soft tissues and spinal canal: No prevertebral fluid or swelling. No visible canal hematoma. Disc levels:  No significant degenerative disc disease. Upper chest: Negative. IMPRESSION: 1. Normal head CT. 2. No evidence of acute cervical injury. Electronically Signed   By: Aletta Edouard M.D.   On: 11/12/2017 17:37   Ct Cervical Spine Wo Contrast  Result Date: 11/12/2017 CLINICAL DATA:  Fall hitting posterior portion of head. Posterior headache and neck pain. EXAM: CT HEAD WITHOUT CONTRAST CT CERVICAL SPINE WITHOUT CONTRAST TECHNIQUE: Multidetector CT imaging of the head and cervical spine was performed following the standard protocol without intravenous contrast. Multiplanar CT image reconstructions of the cervical spine were also generated. COMPARISON:  None. FINDINGS: CT HEAD FINDINGS Brain: No evidence of acute infarction, hemorrhage, hydrocephalus, extra-axial collection or mass lesion/mass effect. Vascular: No hyperdense vessel or unexpected calcification. Skull: Normal. Negative for fracture or focal lesion. Sinuses/Orbits:  No acute finding. Other: None. CT CERVICAL SPINE FINDINGS Alignment: Normal. Skull base and vertebrae: No acute fracture. No primary bone lesion or focal pathologic process. Soft tissues and spinal canal: No prevertebral fluid or swelling. No visible canal hematoma. Disc levels:  No significant degenerative disc disease. Upper chest: Negative. IMPRESSION: 1. Normal head CT. 2. No evidence of acute cervical injury. Electronically Signed    By: Aletta Edouard M.D.   On: 11/12/2017 17:37   Mr Thoracic Spine Wo Contrast  Result Date: 11/12/2017 CLINICAL DATA:  Initial evaluation for acute mid to lower back pain status post trauma. EXAM: MRI THORACIC AND LUMBAR SPINE WITHOUT CONTRAST TECHNIQUE: Multiplanar and multiecho pulse sequences of the thoracic and lumbar spine were obtained without intravenous contrast. COMPARISON:  Prior radiograph from earlier the same day. FINDINGS: MRI THORACIC SPINE FINDINGS Alignment: Moderate levoscoliosis with mild exaggeration of the normal thoracic kyphosis. No listhesis. Vertebrae: Vertebral body heights are maintained without evidence for acute or chronic fracture. Bone marrow signal intensity within normal limits. No discrete or worrisome osseous lesions. No abnormal marrow edema. Cord: Signal intensity within the thoracic spinal cord is normal. Normal cord caliber and morphology. Paraspinal and other soft tissues: Paraspinous soft tissues demonstrate no acute finding. Trace layering effusions noted within the lungs bilaterally. 19 mm heterogeneous T2 hyperintense left thyroid nodule noted (series 5, image 10). Disc levels: No significant findings are seen through the T7-8 level. T8-9: Small left paracentral disc protrusion indents the left ventral thecal sac. No canal or foraminal stenosis. T9-10: Moderate sized left paracentral disc protrusion indents the left ventral thecal sac (series 8, image 29). No significant stenosis or cord deformity. Mild left foraminal narrowing. T10-11: Mild disc bulge. Superimposed left paracentral/foraminal disc protrusion mildly indents the left ventral thecal sac. Mild facet hypertrophy. Central canal remains patent. Mild left neural foraminal narrowing. T11-12: Mild facet hypertrophy.  No stenosis. T12-L1: Mild facet hypertrophy.  No stenosis. MRI LUMBAR SPINE FINDINGS Segmentation: Normal segmentation. Lowest well-formed disc labeled the L5-S1 level. Alignment: Mild  dextroscoliosis. Alignment otherwise normal with preservation of the normal lumbar lordosis. No listhesis. Vertebrae: Vertebral body heights maintained without evidence for acute or chronic fracture. Bone marrow signal intensity within normal limits. No discrete or worrisome osseous lesions. No abnormal marrow edema. Conus medullaris and cauda equina: Conus extends to the L2 level. Conus and cauda equina appear normal. Paraspinal and other soft tissues: Paraspinous soft tissues demonstrate no acute finding. Visualized visceral structures grossly unremarkable. Disc levels: L1-2:  Negative interspace.  Mild facet hypertrophy.  No stenosis. L2-3: Negative interspace. Mild bilateral facet and ligament flavum hypertrophy. Tiny 5 mm synovial cyst at the medial aspect of the left L2-3 facet. No significant stenosis. L3-4: Mild annular disc bulge with disc desiccation. Disc bulging slightly asymmetric to the left. Mild facet and ligament flavum hypertrophy. Resultant mild canal with bilateral lateral recess narrowing, slightly worse on the left. Mild left L3 foraminal narrowing. L4-5: Mild diffuse disc bulge with disc desiccation. Disc bulging slightly asymmetric to the left. Mild facet and ligament flavum hypertrophy. Resultant mild spinal stenosis. Mild left L4 foraminal narrowing. L5-S1:  Mild facet hypertrophy.  No stenosis. IMPRESSION: MR THORACIC SPINE IMPRESSION 1. No acute traumatic injury or other finding identified within the thoracic spine. 2. Left paracentral disc protrusions at T8-9 through T10-11 without significant spinal stenosis. Mild left neural foraminal narrowing at T9-10 and T10-11. 3. Moderate levoscoliosis. 4. 1.9 cm left thyroid nodule. Follow-up examination with dedicated nonemergent thyroid ultrasound recommended for further  evaluation. MR LUMBAR SPINE IMPRESSION 1. No acute traumatic injury or other finding within the lumbar spine. 2. Mild left eccentric disc bulging and facet hypertrophy at L3-4  and L4-5 with resultant mild spinal stenosis, with mild left L3 and L4 foraminal narrowing. Electronically Signed   By: Jeannine Boga M.D.   On: 11/12/2017 22:50   Mr Lumbar Spine Wo Contrast  Result Date: 11/12/2017 CLINICAL DATA:  Initial evaluation for acute mid to lower back pain status post trauma. EXAM: MRI THORACIC AND LUMBAR SPINE WITHOUT CONTRAST TECHNIQUE: Multiplanar and multiecho pulse sequences of the thoracic and lumbar spine were obtained without intravenous contrast. COMPARISON:  Prior radiograph from earlier the same day. FINDINGS: MRI THORACIC SPINE FINDINGS Alignment: Moderate levoscoliosis with mild exaggeration of the normal thoracic kyphosis. No listhesis. Vertebrae: Vertebral body heights are maintained without evidence for acute or chronic fracture. Bone marrow signal intensity within normal limits. No discrete or worrisome osseous lesions. No abnormal marrow edema. Cord: Signal intensity within the thoracic spinal cord is normal. Normal cord caliber and morphology. Paraspinal and other soft tissues: Paraspinous soft tissues demonstrate no acute finding. Trace layering effusions noted within the lungs bilaterally. 19 mm heterogeneous T2 hyperintense left thyroid nodule noted (series 5, image 10). Disc levels: No significant findings are seen through the T7-8 level. T8-9: Small left paracentral disc protrusion indents the left ventral thecal sac. No canal or foraminal stenosis. T9-10: Moderate sized left paracentral disc protrusion indents the left ventral thecal sac (series 8, image 29). No significant stenosis or cord deformity. Mild left foraminal narrowing. T10-11: Mild disc bulge. Superimposed left paracentral/foraminal disc protrusion mildly indents the left ventral thecal sac. Mild facet hypertrophy. Central canal remains patent. Mild left neural foraminal narrowing. T11-12: Mild facet hypertrophy.  No stenosis. T12-L1: Mild facet hypertrophy.  No stenosis. MRI LUMBAR SPINE  FINDINGS Segmentation: Normal segmentation. Lowest well-formed disc labeled the L5-S1 level. Alignment: Mild dextroscoliosis. Alignment otherwise normal with preservation of the normal lumbar lordosis. No listhesis. Vertebrae: Vertebral body heights maintained without evidence for acute or chronic fracture. Bone marrow signal intensity within normal limits. No discrete or worrisome osseous lesions. No abnormal marrow edema. Conus medullaris and cauda equina: Conus extends to the L2 level. Conus and cauda equina appear normal. Paraspinal and other soft tissues: Paraspinous soft tissues demonstrate no acute finding. Visualized visceral structures grossly unremarkable. Disc levels: L1-2:  Negative interspace.  Mild facet hypertrophy.  No stenosis. L2-3: Negative interspace. Mild bilateral facet and ligament flavum hypertrophy. Tiny 5 mm synovial cyst at the medial aspect of the left L2-3 facet. No significant stenosis. L3-4: Mild annular disc bulge with disc desiccation. Disc bulging slightly asymmetric to the left. Mild facet and ligament flavum hypertrophy. Resultant mild canal with bilateral lateral recess narrowing, slightly worse on the left. Mild left L3 foraminal narrowing. L4-5: Mild diffuse disc bulge with disc desiccation. Disc bulging slightly asymmetric to the left. Mild facet and ligament flavum hypertrophy. Resultant mild spinal stenosis. Mild left L4 foraminal narrowing. L5-S1:  Mild facet hypertrophy.  No stenosis. IMPRESSION: MR THORACIC SPINE IMPRESSION 1. No acute traumatic injury or other finding identified within the thoracic spine. 2. Left paracentral disc protrusions at T8-9 through T10-11 without significant spinal stenosis. Mild left neural foraminal narrowing at T9-10 and T10-11. 3. Moderate levoscoliosis. 4. 1.9 cm left thyroid nodule. Follow-up examination with dedicated nonemergent thyroid ultrasound recommended for further evaluation. MR LUMBAR SPINE IMPRESSION 1. No acute traumatic injury  or other finding within the lumbar spine. 2. Mild  left eccentric disc bulging and facet hypertrophy at L3-4 and L4-5 with resultant mild spinal stenosis, with mild left L3 and L4 foraminal narrowing. Electronically Signed   By: Jeannine Boga M.D.   On: 11/12/2017 22:50   Dg Hip Unilat W Or Wo Pelvis 2-3 Views Left  Result Date: 11/12/2017 CLINICAL DATA:  Fall with left hip pain. EXAM: DG HIP (WITH OR WITHOUT PELVIS) 2-3V LEFT COMPARISON:  None. FINDINGS: No acute fracture or dislocation. No significant degenerative disease. The bony pelvis is intact without evidence of fracture or diastasis. No bony lesions identified. IMPRESSION: No acute findings. Electronically Signed   By: Aletta Edouard M.D.   On: 11/12/2017 17:49   Dg Hip Unilat W Or Wo Pelvis 2-3 Views Right  Result Date: 11/12/2017 CLINICAL DATA:  Patient slipped on floor. Low to mid back pain radiating to the pelvis and hips. EXAM: DG HIP (WITH OR WITHOUT PELVIS) 2-3V RIGHT COMPARISON:  None. FINDINGS: There is no evidence of hip fracture or dislocation. No pelvic diastasis of the included pubic symphysis and right SI joint. Phleboliths are noted in the right hemipelvis. There is moderate lower lumbar facet arthropathy, degenerative in etiology. The included arcuate lines of the sacrum appear intact. IMPRESSION: 1. Lower lumbar facet arthropathy. 2. No acute osseous abnormality of the right hip and included pelvis. Electronically Signed   By: Ashley Royalty M.D.   On: 11/12/2017 18:46    Procedures Procedures (including critical care time)  Medications Ordered in ED Medications  HYDROcodone-acetaminophen (NORCO/VICODIN) 5-325 MG per tablet 1 tablet (1 tablet Oral Given 11/12/17 1659)     Initial Impression / Assessment and Plan / ED Course  I have reviewed the triage vital signs and the nursing notes.  Pertinent labs & imaging results that were available during my care of the patient were reviewed by me and considered in my  medical decision making (see chart for details).    On initial examination, patient with reported inability to move or feel lower extremities, but does have normal tone and normal reflexes.  Will assess with MRI.  Patient independently evaluated by Dr. Quintella Reichert.  On reassessment of patient after MRI, patient was moving bilateral lower extremities symmetrically in the bed.   MRI images and report are reviewed.  No evidence of acute traumatic abnormality.  In regards to patient's complaint that she cannot walk or feel her lower extremities, this became progressively better during emergency department course.  Unclear etiology of patient's symptoms, as they occurred immediately after trauma, and she has no acute intracranial abnormality nor thoracic or lumbar abnormality.  Suspect either neuropraxia, or degenerative changes.  Patient did ambulate in emergency department, but reports feeling unsteady on her feet.  Recommend pain control with Tylenol and Salon PAS patches to avoid sedating medications.  Patient with left thyroid nodule.  Noted to patient.  Recommend follow-up with primary care provider.  Return precautions were given for any gait disturbance, weakness or numbness in lower extremities, saddle anesthesia, loss of bowel or bladder control.  This is a shared visit with Dr. Quintella Reichert. Patient was independently evaluated by this attending physician. Attending physician consulted in evaluation and discharge management.  Final Clinical Impressions(s) / ED Diagnoses   Final diagnoses:  Fall, initial encounter  Acute pain of right shoulder  Acute bilateral low back pain without sciatica  Bilateral hip pain  Thyroid nodule    ED Discharge Orders    None       Langston Masker  Peyton Najjar 11/13/17 0157    Quintella Reichert, MD 11/15/17 1009

## 2017-11-12 NOTE — ED Triage Notes (Signed)
Patient was transported via [facility] from [place]. Per EMS, patient slipped on some soda on the floor. Patient hit her head but no LOC. Denies taking blood thinner.s Patient is complaining of left hip and right shoulder pain. Patient reports she is unable to move her lower extremities. However, she has strong pedal pulse and cap refill less than 2 seconds.

## 2017-11-12 NOTE — ED Notes (Signed)
Bed: [facility] Expected date:  Expected time:  Means of arrival:  Comments: EMS-fall 

## 2017-11-18 DIAGNOSIS — M62830 Muscle spasm of back: Secondary | ICD-10-CM | POA: Diagnosis not present

## 2017-11-18 DIAGNOSIS — W19XXXD Unspecified fall, subsequent encounter: Secondary | ICD-10-CM | POA: Diagnosis not present

## 2017-11-18 DIAGNOSIS — Z23 Encounter for immunization: Secondary | ICD-10-CM | POA: Diagnosis not present

## 2017-11-18 DIAGNOSIS — E041 Nontoxic single thyroid nodule: Secondary | ICD-10-CM | POA: Diagnosis not present

## 2017-11-21 ENCOUNTER — Encounter (INDEPENDENT_AMBULATORY_CARE_PROVIDER_SITE_OTHER): Payer: Medicare HMO | Admitting: Ophthalmology

## 2017-11-21 DIAGNOSIS — E11311 Type 2 diabetes mellitus with unspecified diabetic retinopathy with macular edema: Secondary | ICD-10-CM | POA: Diagnosis not present

## 2017-11-21 DIAGNOSIS — E113313 Type 2 diabetes mellitus with moderate nonproliferative diabetic retinopathy with macular edema, bilateral: Secondary | ICD-10-CM

## 2017-11-21 DIAGNOSIS — H43813 Vitreous degeneration, bilateral: Secondary | ICD-10-CM

## 2017-11-21 DIAGNOSIS — H2513 Age-related nuclear cataract, bilateral: Secondary | ICD-10-CM

## 2017-12-03 DIAGNOSIS — M5431 Sciatica, right side: Secondary | ICD-10-CM | POA: Diagnosis not present

## 2017-12-03 DIAGNOSIS — M62838 Other muscle spasm: Secondary | ICD-10-CM | POA: Diagnosis not present

## 2017-12-17 DIAGNOSIS — M5431 Sciatica, right side: Secondary | ICD-10-CM | POA: Diagnosis not present

## 2017-12-17 DIAGNOSIS — M5432 Sciatica, left side: Secondary | ICD-10-CM | POA: Diagnosis not present

## 2017-12-17 DIAGNOSIS — M6281 Muscle weakness (generalized): Secondary | ICD-10-CM | POA: Diagnosis not present

## 2017-12-17 DIAGNOSIS — M545 Low back pain: Secondary | ICD-10-CM | POA: Diagnosis not present

## 2017-12-24 DIAGNOSIS — M6281 Muscle weakness (generalized): Secondary | ICD-10-CM | POA: Diagnosis not present

## 2017-12-24 DIAGNOSIS — M5431 Sciatica, right side: Secondary | ICD-10-CM | POA: Diagnosis not present

## 2017-12-24 DIAGNOSIS — M545 Low back pain: Secondary | ICD-10-CM | POA: Diagnosis not present

## 2017-12-24 DIAGNOSIS — M5432 Sciatica, left side: Secondary | ICD-10-CM | POA: Diagnosis not present

## 2017-12-26 ENCOUNTER — Encounter (INDEPENDENT_AMBULATORY_CARE_PROVIDER_SITE_OTHER): Payer: Medicare HMO | Admitting: Ophthalmology

## 2017-12-26 DIAGNOSIS — H4313 Vitreous hemorrhage, bilateral: Secondary | ICD-10-CM | POA: Diagnosis not present

## 2017-12-26 DIAGNOSIS — E11311 Type 2 diabetes mellitus with unspecified diabetic retinopathy with macular edema: Secondary | ICD-10-CM | POA: Diagnosis not present

## 2017-12-26 DIAGNOSIS — H2513 Age-related nuclear cataract, bilateral: Secondary | ICD-10-CM | POA: Diagnosis not present

## 2017-12-26 DIAGNOSIS — E113313 Type 2 diabetes mellitus with moderate nonproliferative diabetic retinopathy with macular edema, bilateral: Secondary | ICD-10-CM | POA: Diagnosis not present

## 2017-12-30 DIAGNOSIS — M545 Low back pain: Secondary | ICD-10-CM | POA: Diagnosis not present

## 2017-12-30 DIAGNOSIS — M5431 Sciatica, right side: Secondary | ICD-10-CM | POA: Diagnosis not present

## 2017-12-30 DIAGNOSIS — M5432 Sciatica, left side: Secondary | ICD-10-CM | POA: Diagnosis not present

## 2017-12-30 DIAGNOSIS — M6281 Muscle weakness (generalized): Secondary | ICD-10-CM | POA: Diagnosis not present

## 2018-01-02 DIAGNOSIS — M6281 Muscle weakness (generalized): Secondary | ICD-10-CM | POA: Diagnosis not present

## 2018-01-02 DIAGNOSIS — M5431 Sciatica, right side: Secondary | ICD-10-CM | POA: Diagnosis not present

## 2018-01-02 DIAGNOSIS — M545 Low back pain: Secondary | ICD-10-CM | POA: Diagnosis not present

## 2018-01-02 DIAGNOSIS — M5432 Sciatica, left side: Secondary | ICD-10-CM | POA: Diagnosis not present

## 2018-01-08 DIAGNOSIS — M6281 Muscle weakness (generalized): Secondary | ICD-10-CM | POA: Diagnosis not present

## 2018-01-08 DIAGNOSIS — M545 Low back pain: Secondary | ICD-10-CM | POA: Diagnosis not present

## 2018-01-08 DIAGNOSIS — M5431 Sciatica, right side: Secondary | ICD-10-CM | POA: Diagnosis not present

## 2018-01-08 DIAGNOSIS — M5432 Sciatica, left side: Secondary | ICD-10-CM | POA: Diagnosis not present

## 2018-01-12 DIAGNOSIS — M6281 Muscle weakness (generalized): Secondary | ICD-10-CM | POA: Diagnosis not present

## 2018-01-12 DIAGNOSIS — M5432 Sciatica, left side: Secondary | ICD-10-CM | POA: Diagnosis not present

## 2018-01-12 DIAGNOSIS — M5431 Sciatica, right side: Secondary | ICD-10-CM | POA: Diagnosis not present

## 2018-01-12 DIAGNOSIS — M545 Low back pain: Secondary | ICD-10-CM | POA: Diagnosis not present

## 2018-01-15 DIAGNOSIS — M6281 Muscle weakness (generalized): Secondary | ICD-10-CM | POA: Diagnosis not present

## 2018-01-15 DIAGNOSIS — M5431 Sciatica, right side: Secondary | ICD-10-CM | POA: Diagnosis not present

## 2018-01-15 DIAGNOSIS — M545 Low back pain: Secondary | ICD-10-CM | POA: Diagnosis not present

## 2018-01-15 DIAGNOSIS — M5432 Sciatica, left side: Secondary | ICD-10-CM | POA: Diagnosis not present

## 2018-01-26 DIAGNOSIS — M545 Low back pain: Secondary | ICD-10-CM | POA: Diagnosis not present

## 2018-01-26 DIAGNOSIS — M6281 Muscle weakness (generalized): Secondary | ICD-10-CM | POA: Diagnosis not present

## 2018-01-26 DIAGNOSIS — M5431 Sciatica, right side: Secondary | ICD-10-CM | POA: Diagnosis not present

## 2018-01-26 DIAGNOSIS — M5432 Sciatica, left side: Secondary | ICD-10-CM | POA: Diagnosis not present

## 2018-01-29 DIAGNOSIS — E1142 Type 2 diabetes mellitus with diabetic polyneuropathy: Secondary | ICD-10-CM | POA: Diagnosis not present

## 2018-01-29 DIAGNOSIS — Z8673 Personal history of transient ischemic attack (TIA), and cerebral infarction without residual deficits: Secondary | ICD-10-CM | POA: Diagnosis not present

## 2018-01-29 DIAGNOSIS — M5431 Sciatica, right side: Secondary | ICD-10-CM | POA: Diagnosis not present

## 2018-01-29 DIAGNOSIS — E785 Hyperlipidemia, unspecified: Secondary | ICD-10-CM | POA: Diagnosis not present

## 2018-01-29 DIAGNOSIS — K210 Gastro-esophageal reflux disease with esophagitis: Secondary | ICD-10-CM | POA: Diagnosis not present

## 2018-01-30 ENCOUNTER — Encounter (INDEPENDENT_AMBULATORY_CARE_PROVIDER_SITE_OTHER): Payer: Medicare HMO | Admitting: Ophthalmology

## 2018-01-30 DIAGNOSIS — H2513 Age-related nuclear cataract, bilateral: Secondary | ICD-10-CM | POA: Diagnosis not present

## 2018-01-30 DIAGNOSIS — H43813 Vitreous degeneration, bilateral: Secondary | ICD-10-CM

## 2018-01-30 DIAGNOSIS — E11311 Type 2 diabetes mellitus with unspecified diabetic retinopathy with macular edema: Secondary | ICD-10-CM

## 2018-01-30 DIAGNOSIS — E113313 Type 2 diabetes mellitus with moderate nonproliferative diabetic retinopathy with macular edema, bilateral: Secondary | ICD-10-CM | POA: Diagnosis not present

## 2018-02-05 DIAGNOSIS — M5431 Sciatica, right side: Secondary | ICD-10-CM | POA: Diagnosis not present

## 2018-02-05 DIAGNOSIS — M5432 Sciatica, left side: Secondary | ICD-10-CM | POA: Diagnosis not present

## 2018-02-05 DIAGNOSIS — M6281 Muscle weakness (generalized): Secondary | ICD-10-CM | POA: Diagnosis not present

## 2018-02-05 DIAGNOSIS — M545 Low back pain: Secondary | ICD-10-CM | POA: Diagnosis not present

## 2018-02-12 DIAGNOSIS — M6281 Muscle weakness (generalized): Secondary | ICD-10-CM | POA: Diagnosis not present

## 2018-02-12 DIAGNOSIS — M5431 Sciatica, right side: Secondary | ICD-10-CM | POA: Diagnosis not present

## 2018-02-12 DIAGNOSIS — M5432 Sciatica, left side: Secondary | ICD-10-CM | POA: Diagnosis not present

## 2018-02-12 DIAGNOSIS — M545 Low back pain: Secondary | ICD-10-CM | POA: Diagnosis not present

## 2018-02-13 DIAGNOSIS — M5431 Sciatica, right side: Secondary | ICD-10-CM | POA: Diagnosis not present

## 2018-02-13 DIAGNOSIS — M6281 Muscle weakness (generalized): Secondary | ICD-10-CM | POA: Diagnosis not present

## 2018-02-13 DIAGNOSIS — M545 Low back pain: Secondary | ICD-10-CM | POA: Diagnosis not present

## 2018-02-13 DIAGNOSIS — M5432 Sciatica, left side: Secondary | ICD-10-CM | POA: Diagnosis not present

## 2018-02-24 DIAGNOSIS — E1142 Type 2 diabetes mellitus with diabetic polyneuropathy: Secondary | ICD-10-CM | POA: Diagnosis not present

## 2018-02-24 DIAGNOSIS — E785 Hyperlipidemia, unspecified: Secondary | ICD-10-CM | POA: Diagnosis not present

## 2018-02-26 DIAGNOSIS — M6281 Muscle weakness (generalized): Secondary | ICD-10-CM | POA: Diagnosis not present

## 2018-02-26 DIAGNOSIS — M5431 Sciatica, right side: Secondary | ICD-10-CM | POA: Diagnosis not present

## 2018-02-26 DIAGNOSIS — M545 Low back pain: Secondary | ICD-10-CM | POA: Diagnosis not present

## 2018-02-26 DIAGNOSIS — M5432 Sciatica, left side: Secondary | ICD-10-CM | POA: Diagnosis not present

## 2018-03-05 DIAGNOSIS — M5432 Sciatica, left side: Secondary | ICD-10-CM | POA: Diagnosis not present

## 2018-03-05 DIAGNOSIS — M5431 Sciatica, right side: Secondary | ICD-10-CM | POA: Diagnosis not present

## 2018-03-05 DIAGNOSIS — M6281 Muscle weakness (generalized): Secondary | ICD-10-CM | POA: Diagnosis not present

## 2018-03-05 DIAGNOSIS — M545 Low back pain: Secondary | ICD-10-CM | POA: Diagnosis not present

## 2018-03-06 DIAGNOSIS — M5432 Sciatica, left side: Secondary | ICD-10-CM | POA: Diagnosis not present

## 2018-03-06 DIAGNOSIS — M5431 Sciatica, right side: Secondary | ICD-10-CM | POA: Diagnosis not present

## 2018-03-06 DIAGNOSIS — M6281 Muscle weakness (generalized): Secondary | ICD-10-CM | POA: Diagnosis not present

## 2018-03-06 DIAGNOSIS — M545 Low back pain: Secondary | ICD-10-CM | POA: Diagnosis not present

## 2018-03-13 ENCOUNTER — Encounter (INDEPENDENT_AMBULATORY_CARE_PROVIDER_SITE_OTHER): Payer: Medicare HMO | Admitting: Ophthalmology

## 2018-03-13 DIAGNOSIS — E113313 Type 2 diabetes mellitus with moderate nonproliferative diabetic retinopathy with macular edema, bilateral: Secondary | ICD-10-CM | POA: Diagnosis not present

## 2018-03-13 DIAGNOSIS — H43813 Vitreous degeneration, bilateral: Secondary | ICD-10-CM

## 2018-03-13 DIAGNOSIS — E11311 Type 2 diabetes mellitus with unspecified diabetic retinopathy with macular edema: Secondary | ICD-10-CM

## 2018-03-13 DIAGNOSIS — H2513 Age-related nuclear cataract, bilateral: Secondary | ICD-10-CM | POA: Diagnosis not present

## 2018-04-02 DIAGNOSIS — M5432 Sciatica, left side: Secondary | ICD-10-CM | POA: Diagnosis not present

## 2018-04-02 DIAGNOSIS — M6281 Muscle weakness (generalized): Secondary | ICD-10-CM | POA: Diagnosis not present

## 2018-04-02 DIAGNOSIS — M5431 Sciatica, right side: Secondary | ICD-10-CM | POA: Diagnosis not present

## 2018-04-02 DIAGNOSIS — M545 Low back pain: Secondary | ICD-10-CM | POA: Diagnosis not present

## 2018-04-09 DIAGNOSIS — M5431 Sciatica, right side: Secondary | ICD-10-CM | POA: Diagnosis not present

## 2018-04-09 DIAGNOSIS — M6281 Muscle weakness (generalized): Secondary | ICD-10-CM | POA: Diagnosis not present

## 2018-04-09 DIAGNOSIS — M5432 Sciatica, left side: Secondary | ICD-10-CM | POA: Diagnosis not present

## 2018-04-09 DIAGNOSIS — M545 Low back pain: Secondary | ICD-10-CM | POA: Diagnosis not present

## 2018-04-24 ENCOUNTER — Encounter (INDEPENDENT_AMBULATORY_CARE_PROVIDER_SITE_OTHER): Payer: Medicare HMO | Admitting: Ophthalmology

## 2018-04-24 DIAGNOSIS — E11311 Type 2 diabetes mellitus with unspecified diabetic retinopathy with macular edema: Secondary | ICD-10-CM | POA: Diagnosis not present

## 2018-04-24 DIAGNOSIS — H43813 Vitreous degeneration, bilateral: Secondary | ICD-10-CM

## 2018-04-24 DIAGNOSIS — H2513 Age-related nuclear cataract, bilateral: Secondary | ICD-10-CM | POA: Diagnosis not present

## 2018-04-24 DIAGNOSIS — E113313 Type 2 diabetes mellitus with moderate nonproliferative diabetic retinopathy with macular edema, bilateral: Secondary | ICD-10-CM

## 2018-04-28 DIAGNOSIS — M6281 Muscle weakness (generalized): Secondary | ICD-10-CM | POA: Diagnosis not present

## 2018-04-28 DIAGNOSIS — M545 Low back pain: Secondary | ICD-10-CM | POA: Diagnosis not present

## 2018-04-28 DIAGNOSIS — M5432 Sciatica, left side: Secondary | ICD-10-CM | POA: Diagnosis not present

## 2018-04-28 DIAGNOSIS — M5431 Sciatica, right side: Secondary | ICD-10-CM | POA: Diagnosis not present

## 2018-05-07 DIAGNOSIS — M5431 Sciatica, right side: Secondary | ICD-10-CM | POA: Diagnosis not present

## 2018-05-07 DIAGNOSIS — M6281 Muscle weakness (generalized): Secondary | ICD-10-CM | POA: Diagnosis not present

## 2018-05-07 DIAGNOSIS — M5432 Sciatica, left side: Secondary | ICD-10-CM | POA: Diagnosis not present

## 2018-05-07 DIAGNOSIS — M545 Low back pain: Secondary | ICD-10-CM | POA: Diagnosis not present

## 2018-05-13 DIAGNOSIS — M545 Low back pain: Secondary | ICD-10-CM | POA: Diagnosis not present

## 2018-05-13 DIAGNOSIS — M5432 Sciatica, left side: Secondary | ICD-10-CM | POA: Diagnosis not present

## 2018-05-13 DIAGNOSIS — M6281 Muscle weakness (generalized): Secondary | ICD-10-CM | POA: Diagnosis not present

## 2018-05-13 DIAGNOSIS — M5431 Sciatica, right side: Secondary | ICD-10-CM | POA: Diagnosis not present

## 2018-05-28 ENCOUNTER — Other Ambulatory Visit: Payer: Self-pay

## 2018-05-28 ENCOUNTER — Encounter (INDEPENDENT_AMBULATORY_CARE_PROVIDER_SITE_OTHER): Payer: Medicare HMO | Admitting: Ophthalmology

## 2018-05-28 DIAGNOSIS — H43813 Vitreous degeneration, bilateral: Secondary | ICD-10-CM

## 2018-05-28 DIAGNOSIS — H2513 Age-related nuclear cataract, bilateral: Secondary | ICD-10-CM | POA: Diagnosis not present

## 2018-05-28 DIAGNOSIS — E113313 Type 2 diabetes mellitus with moderate nonproliferative diabetic retinopathy with macular edema, bilateral: Secondary | ICD-10-CM | POA: Diagnosis not present

## 2018-05-28 DIAGNOSIS — E11311 Type 2 diabetes mellitus with unspecified diabetic retinopathy with macular edema: Secondary | ICD-10-CM | POA: Diagnosis not present

## 2018-06-03 DIAGNOSIS — Z0000 Encounter for general adult medical examination without abnormal findings: Secondary | ICD-10-CM | POA: Diagnosis not present

## 2018-06-03 DIAGNOSIS — E890 Postprocedural hypothyroidism: Secondary | ICD-10-CM | POA: Diagnosis not present

## 2018-06-03 DIAGNOSIS — E1142 Type 2 diabetes mellitus with diabetic polyneuropathy: Secondary | ICD-10-CM | POA: Diagnosis not present

## 2018-06-03 DIAGNOSIS — E785 Hyperlipidemia, unspecified: Secondary | ICD-10-CM | POA: Diagnosis not present

## 2018-06-03 DIAGNOSIS — I6930 Unspecified sequelae of cerebral infarction: Secondary | ICD-10-CM | POA: Diagnosis not present

## 2018-06-03 DIAGNOSIS — K210 Gastro-esophageal reflux disease with esophagitis: Secondary | ICD-10-CM | POA: Diagnosis not present

## 2018-06-03 DIAGNOSIS — Z1389 Encounter for screening for other disorder: Secondary | ICD-10-CM | POA: Diagnosis not present

## 2018-06-04 DIAGNOSIS — M5432 Sciatica, left side: Secondary | ICD-10-CM | POA: Diagnosis not present

## 2018-06-04 DIAGNOSIS — M6281 Muscle weakness (generalized): Secondary | ICD-10-CM | POA: Diagnosis not present

## 2018-06-04 DIAGNOSIS — M545 Low back pain: Secondary | ICD-10-CM | POA: Diagnosis not present

## 2018-06-04 DIAGNOSIS — M5431 Sciatica, right side: Secondary | ICD-10-CM | POA: Diagnosis not present

## 2018-06-11 DIAGNOSIS — E1142 Type 2 diabetes mellitus with diabetic polyneuropathy: Secondary | ICD-10-CM | POA: Diagnosis not present

## 2018-06-11 DIAGNOSIS — K210 Gastro-esophageal reflux disease with esophagitis: Secondary | ICD-10-CM | POA: Diagnosis not present

## 2018-06-12 DIAGNOSIS — M5431 Sciatica, right side: Secondary | ICD-10-CM | POA: Diagnosis not present

## 2018-06-12 DIAGNOSIS — M6281 Muscle weakness (generalized): Secondary | ICD-10-CM | POA: Diagnosis not present

## 2018-06-12 DIAGNOSIS — M545 Low back pain: Secondary | ICD-10-CM | POA: Diagnosis not present

## 2018-06-12 DIAGNOSIS — M5432 Sciatica, left side: Secondary | ICD-10-CM | POA: Diagnosis not present

## 2018-06-24 DIAGNOSIS — E890 Postprocedural hypothyroidism: Secondary | ICD-10-CM | POA: Diagnosis not present

## 2018-06-24 DIAGNOSIS — E1142 Type 2 diabetes mellitus with diabetic polyneuropathy: Secondary | ICD-10-CM | POA: Diagnosis not present

## 2018-06-24 DIAGNOSIS — E113299 Type 2 diabetes mellitus with mild nonproliferative diabetic retinopathy without macular edema, unspecified eye: Secondary | ICD-10-CM | POA: Diagnosis not present

## 2018-06-24 DIAGNOSIS — E785 Hyperlipidemia, unspecified: Secondary | ICD-10-CM | POA: Diagnosis not present

## 2018-06-26 ENCOUNTER — Encounter (INDEPENDENT_AMBULATORY_CARE_PROVIDER_SITE_OTHER): Payer: Medicare HMO | Admitting: Ophthalmology

## 2018-06-26 ENCOUNTER — Other Ambulatory Visit: Payer: Self-pay

## 2018-06-26 DIAGNOSIS — E11311 Type 2 diabetes mellitus with unspecified diabetic retinopathy with macular edema: Secondary | ICD-10-CM

## 2018-06-26 DIAGNOSIS — H2513 Age-related nuclear cataract, bilateral: Secondary | ICD-10-CM | POA: Diagnosis not present

## 2018-06-26 DIAGNOSIS — H43813 Vitreous degeneration, bilateral: Secondary | ICD-10-CM | POA: Diagnosis not present

## 2018-06-26 DIAGNOSIS — E113313 Type 2 diabetes mellitus with moderate nonproliferative diabetic retinopathy with macular edema, bilateral: Secondary | ICD-10-CM

## 2018-07-09 DIAGNOSIS — M6281 Muscle weakness (generalized): Secondary | ICD-10-CM | POA: Diagnosis not present

## 2018-07-09 DIAGNOSIS — M5431 Sciatica, right side: Secondary | ICD-10-CM | POA: Diagnosis not present

## 2018-07-09 DIAGNOSIS — M5432 Sciatica, left side: Secondary | ICD-10-CM | POA: Diagnosis not present

## 2018-07-09 DIAGNOSIS — M545 Low back pain: Secondary | ICD-10-CM | POA: Diagnosis not present

## 2018-07-14 DIAGNOSIS — E785 Hyperlipidemia, unspecified: Secondary | ICD-10-CM | POA: Diagnosis not present

## 2018-07-14 DIAGNOSIS — E890 Postprocedural hypothyroidism: Secondary | ICD-10-CM | POA: Diagnosis not present

## 2018-07-14 DIAGNOSIS — E1142 Type 2 diabetes mellitus with diabetic polyneuropathy: Secondary | ICD-10-CM | POA: Diagnosis not present

## 2018-07-23 DIAGNOSIS — M5432 Sciatica, left side: Secondary | ICD-10-CM | POA: Diagnosis not present

## 2018-07-23 DIAGNOSIS — M5431 Sciatica, right side: Secondary | ICD-10-CM | POA: Diagnosis not present

## 2018-07-23 DIAGNOSIS — M6281 Muscle weakness (generalized): Secondary | ICD-10-CM | POA: Diagnosis not present

## 2018-07-23 DIAGNOSIS — M545 Low back pain: Secondary | ICD-10-CM | POA: Diagnosis not present

## 2018-07-24 ENCOUNTER — Other Ambulatory Visit: Payer: Self-pay

## 2018-07-24 ENCOUNTER — Encounter (INDEPENDENT_AMBULATORY_CARE_PROVIDER_SITE_OTHER): Payer: Medicare HMO | Admitting: Ophthalmology

## 2018-07-24 DIAGNOSIS — E113313 Type 2 diabetes mellitus with moderate nonproliferative diabetic retinopathy with macular edema, bilateral: Secondary | ICD-10-CM | POA: Diagnosis not present

## 2018-07-24 DIAGNOSIS — E11311 Type 2 diabetes mellitus with unspecified diabetic retinopathy with macular edema: Secondary | ICD-10-CM | POA: Diagnosis not present

## 2018-07-24 DIAGNOSIS — H43813 Vitreous degeneration, bilateral: Secondary | ICD-10-CM | POA: Diagnosis not present

## 2018-07-30 DIAGNOSIS — M545 Low back pain: Secondary | ICD-10-CM | POA: Diagnosis not present

## 2018-07-30 DIAGNOSIS — M6281 Muscle weakness (generalized): Secondary | ICD-10-CM | POA: Diagnosis not present

## 2018-07-30 DIAGNOSIS — M5431 Sciatica, right side: Secondary | ICD-10-CM | POA: Diagnosis not present

## 2018-07-30 DIAGNOSIS — M5432 Sciatica, left side: Secondary | ICD-10-CM | POA: Diagnosis not present

## 2018-07-31 DIAGNOSIS — E785 Hyperlipidemia, unspecified: Secondary | ICD-10-CM | POA: Diagnosis not present

## 2018-07-31 DIAGNOSIS — E113299 Type 2 diabetes mellitus with mild nonproliferative diabetic retinopathy without macular edema, unspecified eye: Secondary | ICD-10-CM | POA: Diagnosis not present

## 2018-07-31 DIAGNOSIS — E1142 Type 2 diabetes mellitus with diabetic polyneuropathy: Secondary | ICD-10-CM | POA: Diagnosis not present

## 2018-07-31 DIAGNOSIS — E890 Postprocedural hypothyroidism: Secondary | ICD-10-CM | POA: Diagnosis not present

## 2018-08-07 DIAGNOSIS — M6281 Muscle weakness (generalized): Secondary | ICD-10-CM | POA: Diagnosis not present

## 2018-08-07 DIAGNOSIS — M545 Low back pain: Secondary | ICD-10-CM | POA: Diagnosis not present

## 2018-08-07 DIAGNOSIS — M5431 Sciatica, right side: Secondary | ICD-10-CM | POA: Diagnosis not present

## 2018-08-07 DIAGNOSIS — M5432 Sciatica, left side: Secondary | ICD-10-CM | POA: Diagnosis not present

## 2018-08-21 ENCOUNTER — Encounter (INDEPENDENT_AMBULATORY_CARE_PROVIDER_SITE_OTHER): Payer: Medicare HMO | Admitting: Ophthalmology

## 2018-08-21 ENCOUNTER — Other Ambulatory Visit: Payer: Self-pay

## 2018-08-21 DIAGNOSIS — H43813 Vitreous degeneration, bilateral: Secondary | ICD-10-CM

## 2018-08-21 DIAGNOSIS — E11311 Type 2 diabetes mellitus with unspecified diabetic retinopathy with macular edema: Secondary | ICD-10-CM

## 2018-08-21 DIAGNOSIS — E113313 Type 2 diabetes mellitus with moderate nonproliferative diabetic retinopathy with macular edema, bilateral: Secondary | ICD-10-CM | POA: Diagnosis not present

## 2018-08-21 DIAGNOSIS — H2513 Age-related nuclear cataract, bilateral: Secondary | ICD-10-CM

## 2018-09-03 DIAGNOSIS — M5432 Sciatica, left side: Secondary | ICD-10-CM | POA: Diagnosis not present

## 2018-09-03 DIAGNOSIS — M5431 Sciatica, right side: Secondary | ICD-10-CM | POA: Diagnosis not present

## 2018-09-03 DIAGNOSIS — M545 Low back pain: Secondary | ICD-10-CM | POA: Diagnosis not present

## 2018-09-03 DIAGNOSIS — M6281 Muscle weakness (generalized): Secondary | ICD-10-CM | POA: Diagnosis not present

## 2018-09-08 DIAGNOSIS — M6281 Muscle weakness (generalized): Secondary | ICD-10-CM | POA: Diagnosis not present

## 2018-09-08 DIAGNOSIS — M5432 Sciatica, left side: Secondary | ICD-10-CM | POA: Diagnosis not present

## 2018-09-08 DIAGNOSIS — M545 Low back pain: Secondary | ICD-10-CM | POA: Diagnosis not present

## 2018-09-08 DIAGNOSIS — M5431 Sciatica, right side: Secondary | ICD-10-CM | POA: Diagnosis not present

## 2018-09-18 ENCOUNTER — Encounter (INDEPENDENT_AMBULATORY_CARE_PROVIDER_SITE_OTHER): Payer: Medicare HMO | Admitting: Ophthalmology

## 2018-09-18 ENCOUNTER — Other Ambulatory Visit: Payer: Self-pay

## 2018-09-18 DIAGNOSIS — H43813 Vitreous degeneration, bilateral: Secondary | ICD-10-CM

## 2018-09-18 DIAGNOSIS — H2513 Age-related nuclear cataract, bilateral: Secondary | ICD-10-CM | POA: Diagnosis not present

## 2018-09-18 DIAGNOSIS — E113313 Type 2 diabetes mellitus with moderate nonproliferative diabetic retinopathy with macular edema, bilateral: Secondary | ICD-10-CM | POA: Diagnosis not present

## 2018-09-18 DIAGNOSIS — E11311 Type 2 diabetes mellitus with unspecified diabetic retinopathy with macular edema: Secondary | ICD-10-CM

## 2018-09-28 DIAGNOSIS — M545 Low back pain: Secondary | ICD-10-CM | POA: Diagnosis not present

## 2018-09-28 DIAGNOSIS — M5432 Sciatica, left side: Secondary | ICD-10-CM | POA: Diagnosis not present

## 2018-09-28 DIAGNOSIS — M5431 Sciatica, right side: Secondary | ICD-10-CM | POA: Diagnosis not present

## 2018-09-28 DIAGNOSIS — M6281 Muscle weakness (generalized): Secondary | ICD-10-CM | POA: Diagnosis not present

## 2018-10-08 DIAGNOSIS — M5432 Sciatica, left side: Secondary | ICD-10-CM | POA: Diagnosis not present

## 2018-10-08 DIAGNOSIS — M5431 Sciatica, right side: Secondary | ICD-10-CM | POA: Diagnosis not present

## 2018-10-08 DIAGNOSIS — M6281 Muscle weakness (generalized): Secondary | ICD-10-CM | POA: Diagnosis not present

## 2018-10-08 DIAGNOSIS — M545 Low back pain: Secondary | ICD-10-CM | POA: Diagnosis not present

## 2018-10-09 DIAGNOSIS — M5431 Sciatica, right side: Secondary | ICD-10-CM | POA: Diagnosis not present

## 2018-10-09 DIAGNOSIS — M6281 Muscle weakness (generalized): Secondary | ICD-10-CM | POA: Diagnosis not present

## 2018-10-09 DIAGNOSIS — M545 Low back pain: Secondary | ICD-10-CM | POA: Diagnosis not present

## 2018-10-09 DIAGNOSIS — M5432 Sciatica, left side: Secondary | ICD-10-CM | POA: Diagnosis not present

## 2018-10-16 ENCOUNTER — Encounter (INDEPENDENT_AMBULATORY_CARE_PROVIDER_SITE_OTHER): Payer: Medicare HMO | Admitting: Ophthalmology

## 2018-10-16 ENCOUNTER — Other Ambulatory Visit: Payer: Self-pay

## 2018-10-16 DIAGNOSIS — H43813 Vitreous degeneration, bilateral: Secondary | ICD-10-CM | POA: Diagnosis not present

## 2018-10-16 DIAGNOSIS — E113313 Type 2 diabetes mellitus with moderate nonproliferative diabetic retinopathy with macular edema, bilateral: Secondary | ICD-10-CM | POA: Diagnosis not present

## 2018-10-16 DIAGNOSIS — E11311 Type 2 diabetes mellitus with unspecified diabetic retinopathy with macular edema: Secondary | ICD-10-CM

## 2018-10-16 DIAGNOSIS — H2513 Age-related nuclear cataract, bilateral: Secondary | ICD-10-CM

## 2018-10-26 DIAGNOSIS — E1142 Type 2 diabetes mellitus with diabetic polyneuropathy: Secondary | ICD-10-CM | POA: Diagnosis not present

## 2018-10-26 DIAGNOSIS — E785 Hyperlipidemia, unspecified: Secondary | ICD-10-CM | POA: Diagnosis not present

## 2018-10-26 DIAGNOSIS — E890 Postprocedural hypothyroidism: Secondary | ICD-10-CM | POA: Diagnosis not present

## 2018-10-26 DIAGNOSIS — Z7984 Long term (current) use of oral hypoglycemic drugs: Secondary | ICD-10-CM | POA: Diagnosis not present

## 2018-11-09 ENCOUNTER — Other Ambulatory Visit: Payer: Self-pay | Admitting: Internal Medicine

## 2018-11-09 DIAGNOSIS — Z1231 Encounter for screening mammogram for malignant neoplasm of breast: Secondary | ICD-10-CM

## 2018-11-13 ENCOUNTER — Other Ambulatory Visit: Payer: Self-pay

## 2018-11-13 ENCOUNTER — Encounter (INDEPENDENT_AMBULATORY_CARE_PROVIDER_SITE_OTHER): Payer: Medicare HMO | Admitting: Ophthalmology

## 2018-11-13 DIAGNOSIS — E113313 Type 2 diabetes mellitus with moderate nonproliferative diabetic retinopathy with macular edema, bilateral: Secondary | ICD-10-CM

## 2018-11-13 DIAGNOSIS — H43813 Vitreous degeneration, bilateral: Secondary | ICD-10-CM

## 2018-11-13 DIAGNOSIS — H2513 Age-related nuclear cataract, bilateral: Secondary | ICD-10-CM | POA: Diagnosis not present

## 2018-11-13 DIAGNOSIS — E11311 Type 2 diabetes mellitus with unspecified diabetic retinopathy with macular edema: Secondary | ICD-10-CM

## 2018-11-24 DIAGNOSIS — E785 Hyperlipidemia, unspecified: Secondary | ICD-10-CM | POA: Diagnosis not present

## 2018-11-24 DIAGNOSIS — E113292 Type 2 diabetes mellitus with mild nonproliferative diabetic retinopathy without macular edema, left eye: Secondary | ICD-10-CM | POA: Diagnosis not present

## 2018-11-24 DIAGNOSIS — E890 Postprocedural hypothyroidism: Secondary | ICD-10-CM | POA: Diagnosis not present

## 2018-12-11 ENCOUNTER — Encounter (INDEPENDENT_AMBULATORY_CARE_PROVIDER_SITE_OTHER): Payer: Medicare HMO | Admitting: Ophthalmology

## 2018-12-11 DIAGNOSIS — E11311 Type 2 diabetes mellitus with unspecified diabetic retinopathy with macular edema: Secondary | ICD-10-CM | POA: Diagnosis not present

## 2018-12-11 DIAGNOSIS — E113313 Type 2 diabetes mellitus with moderate nonproliferative diabetic retinopathy with macular edema, bilateral: Secondary | ICD-10-CM

## 2018-12-11 DIAGNOSIS — H43813 Vitreous degeneration, bilateral: Secondary | ICD-10-CM

## 2018-12-14 DIAGNOSIS — E1142 Type 2 diabetes mellitus with diabetic polyneuropathy: Secondary | ICD-10-CM | POA: Diagnosis not present

## 2018-12-14 DIAGNOSIS — Z23 Encounter for immunization: Secondary | ICD-10-CM | POA: Diagnosis not present

## 2018-12-14 DIAGNOSIS — D509 Iron deficiency anemia, unspecified: Secondary | ICD-10-CM | POA: Diagnosis not present

## 2018-12-14 DIAGNOSIS — E890 Postprocedural hypothyroidism: Secondary | ICD-10-CM | POA: Diagnosis not present

## 2018-12-14 DIAGNOSIS — Z7984 Long term (current) use of oral hypoglycemic drugs: Secondary | ICD-10-CM | POA: Diagnosis not present

## 2018-12-14 DIAGNOSIS — E611 Iron deficiency: Secondary | ICD-10-CM | POA: Diagnosis not present

## 2018-12-14 DIAGNOSIS — E11319 Type 2 diabetes mellitus with unspecified diabetic retinopathy without macular edema: Secondary | ICD-10-CM | POA: Diagnosis not present

## 2018-12-24 ENCOUNTER — Other Ambulatory Visit: Payer: Self-pay

## 2018-12-24 ENCOUNTER — Ambulatory Visit
Admission: RE | Admit: 2018-12-24 | Discharge: 2018-12-24 | Disposition: A | Discharge status: Home / Self Care | Payer: Medicare HMO | Source: Ambulatory Visit | Attending: Internal Medicine | Admitting: Internal Medicine

## 2018-12-24 DIAGNOSIS — Z1231 Encounter for screening mammogram for malignant neoplasm of breast: Secondary | ICD-10-CM | POA: Diagnosis not present

## 2019-01-08 ENCOUNTER — Other Ambulatory Visit: Payer: Self-pay

## 2019-01-08 ENCOUNTER — Encounter (INDEPENDENT_AMBULATORY_CARE_PROVIDER_SITE_OTHER): Payer: Medicare HMO | Admitting: Ophthalmology

## 2019-01-08 DIAGNOSIS — H2513 Age-related nuclear cataract, bilateral: Secondary | ICD-10-CM | POA: Diagnosis not present

## 2019-01-08 DIAGNOSIS — E11311 Type 2 diabetes mellitus with unspecified diabetic retinopathy with macular edema: Secondary | ICD-10-CM | POA: Diagnosis not present

## 2019-01-08 DIAGNOSIS — E113313 Type 2 diabetes mellitus with moderate nonproliferative diabetic retinopathy with macular edema, bilateral: Secondary | ICD-10-CM | POA: Diagnosis not present

## 2019-01-08 DIAGNOSIS — H43813 Vitreous degeneration, bilateral: Secondary | ICD-10-CM | POA: Diagnosis not present

## 2019-01-22 DIAGNOSIS — E1142 Type 2 diabetes mellitus with diabetic polyneuropathy: Secondary | ICD-10-CM | POA: Diagnosis not present

## 2019-01-22 DIAGNOSIS — D509 Iron deficiency anemia, unspecified: Secondary | ICD-10-CM | POA: Diagnosis not present

## 2019-01-22 DIAGNOSIS — E785 Hyperlipidemia, unspecified: Secondary | ICD-10-CM | POA: Diagnosis not present

## 2019-01-22 DIAGNOSIS — E890 Postprocedural hypothyroidism: Secondary | ICD-10-CM | POA: Diagnosis not present

## 2019-01-22 DIAGNOSIS — Z7984 Long term (current) use of oral hypoglycemic drugs: Secondary | ICD-10-CM | POA: Diagnosis not present

## 2019-02-05 ENCOUNTER — Other Ambulatory Visit: Payer: Self-pay

## 2019-02-05 ENCOUNTER — Encounter (INDEPENDENT_AMBULATORY_CARE_PROVIDER_SITE_OTHER): Payer: Medicare HMO | Admitting: Ophthalmology

## 2019-02-05 DIAGNOSIS — H2513 Age-related nuclear cataract, bilateral: Secondary | ICD-10-CM

## 2019-02-05 DIAGNOSIS — H43813 Vitreous degeneration, bilateral: Secondary | ICD-10-CM

## 2019-02-05 DIAGNOSIS — E11311 Type 2 diabetes mellitus with unspecified diabetic retinopathy with macular edema: Secondary | ICD-10-CM | POA: Diagnosis not present

## 2019-02-05 DIAGNOSIS — E113313 Type 2 diabetes mellitus with moderate nonproliferative diabetic retinopathy with macular edema, bilateral: Secondary | ICD-10-CM

## 2019-02-10 DIAGNOSIS — E890 Postprocedural hypothyroidism: Secondary | ICD-10-CM | POA: Diagnosis not present

## 2019-02-10 DIAGNOSIS — E785 Hyperlipidemia, unspecified: Secondary | ICD-10-CM | POA: Diagnosis not present

## 2019-02-10 DIAGNOSIS — D509 Iron deficiency anemia, unspecified: Secondary | ICD-10-CM | POA: Diagnosis not present

## 2019-02-10 DIAGNOSIS — E1142 Type 2 diabetes mellitus with diabetic polyneuropathy: Secondary | ICD-10-CM | POA: Diagnosis not present

## 2019-03-12 ENCOUNTER — Encounter (INDEPENDENT_AMBULATORY_CARE_PROVIDER_SITE_OTHER): Payer: Medicare HMO | Admitting: Ophthalmology

## 2019-03-12 DIAGNOSIS — E11311 Type 2 diabetes mellitus with unspecified diabetic retinopathy with macular edema: Secondary | ICD-10-CM | POA: Diagnosis not present

## 2019-03-12 DIAGNOSIS — H43813 Vitreous degeneration, bilateral: Secondary | ICD-10-CM

## 2019-03-12 DIAGNOSIS — E113313 Type 2 diabetes mellitus with moderate nonproliferative diabetic retinopathy with macular edema, bilateral: Secondary | ICD-10-CM | POA: Diagnosis not present

## 2019-04-09 ENCOUNTER — Encounter (INDEPENDENT_AMBULATORY_CARE_PROVIDER_SITE_OTHER): Payer: Medicare HMO | Admitting: Ophthalmology

## 2019-04-09 DIAGNOSIS — H2513 Age-related nuclear cataract, bilateral: Secondary | ICD-10-CM | POA: Diagnosis not present

## 2019-04-09 DIAGNOSIS — H43813 Vitreous degeneration, bilateral: Secondary | ICD-10-CM

## 2019-04-09 DIAGNOSIS — E113313 Type 2 diabetes mellitus with moderate nonproliferative diabetic retinopathy with macular edema, bilateral: Secondary | ICD-10-CM | POA: Diagnosis not present

## 2019-04-09 DIAGNOSIS — E11311 Type 2 diabetes mellitus with unspecified diabetic retinopathy with macular edema: Secondary | ICD-10-CM

## 2019-04-22 DIAGNOSIS — E785 Hyperlipidemia, unspecified: Secondary | ICD-10-CM | POA: Diagnosis not present

## 2019-04-22 DIAGNOSIS — E113299 Type 2 diabetes mellitus with mild nonproliferative diabetic retinopathy without macular edema, unspecified eye: Secondary | ICD-10-CM | POA: Diagnosis not present

## 2019-04-22 DIAGNOSIS — E1140 Type 2 diabetes mellitus with diabetic neuropathy, unspecified: Secondary | ICD-10-CM | POA: Diagnosis not present

## 2019-04-22 DIAGNOSIS — E890 Postprocedural hypothyroidism: Secondary | ICD-10-CM | POA: Diagnosis not present

## 2019-04-22 DIAGNOSIS — D509 Iron deficiency anemia, unspecified: Secondary | ICD-10-CM | POA: Diagnosis not present

## 2019-04-27 DIAGNOSIS — M545 Low back pain: Secondary | ICD-10-CM | POA: Diagnosis not present

## 2019-04-27 DIAGNOSIS — M9904 Segmental and somatic dysfunction of sacral region: Secondary | ICD-10-CM | POA: Diagnosis not present

## 2019-05-07 DIAGNOSIS — M5416 Radiculopathy, lumbar region: Secondary | ICD-10-CM | POA: Diagnosis not present

## 2019-05-10 DIAGNOSIS — M5416 Radiculopathy, lumbar region: Secondary | ICD-10-CM | POA: Insufficient documentation

## 2019-05-14 ENCOUNTER — Encounter (INDEPENDENT_AMBULATORY_CARE_PROVIDER_SITE_OTHER): Payer: Medicare HMO | Admitting: Ophthalmology

## 2019-05-14 ENCOUNTER — Other Ambulatory Visit: Payer: Self-pay

## 2019-05-14 DIAGNOSIS — E11311 Type 2 diabetes mellitus with unspecified diabetic retinopathy with macular edema: Secondary | ICD-10-CM | POA: Diagnosis not present

## 2019-05-14 DIAGNOSIS — I10 Essential (primary) hypertension: Secondary | ICD-10-CM

## 2019-05-14 DIAGNOSIS — E113313 Type 2 diabetes mellitus with moderate nonproliferative diabetic retinopathy with macular edema, bilateral: Secondary | ICD-10-CM | POA: Diagnosis not present

## 2019-05-14 DIAGNOSIS — H35033 Hypertensive retinopathy, bilateral: Secondary | ICD-10-CM | POA: Diagnosis not present

## 2019-05-14 DIAGNOSIS — H43813 Vitreous degeneration, bilateral: Secondary | ICD-10-CM | POA: Diagnosis not present

## 2019-05-14 DIAGNOSIS — H2513 Age-related nuclear cataract, bilateral: Secondary | ICD-10-CM | POA: Diagnosis not present

## 2019-05-19 DIAGNOSIS — M5416 Radiculopathy, lumbar region: Secondary | ICD-10-CM | POA: Diagnosis not present

## 2019-05-19 DIAGNOSIS — E785 Hyperlipidemia, unspecified: Secondary | ICD-10-CM | POA: Diagnosis not present

## 2019-05-19 DIAGNOSIS — E1142 Type 2 diabetes mellitus with diabetic polyneuropathy: Secondary | ICD-10-CM | POA: Diagnosis not present

## 2019-05-19 DIAGNOSIS — D509 Iron deficiency anemia, unspecified: Secondary | ICD-10-CM | POA: Diagnosis not present

## 2019-05-19 DIAGNOSIS — E890 Postprocedural hypothyroidism: Secondary | ICD-10-CM | POA: Diagnosis not present

## 2019-05-21 DIAGNOSIS — M5416 Radiculopathy, lumbar region: Secondary | ICD-10-CM | POA: Diagnosis not present

## 2019-05-26 DIAGNOSIS — M5416 Radiculopathy, lumbar region: Secondary | ICD-10-CM | POA: Diagnosis not present

## 2019-06-07 DIAGNOSIS — M5416 Radiculopathy, lumbar region: Secondary | ICD-10-CM | POA: Diagnosis not present

## 2019-06-08 DIAGNOSIS — M5416 Radiculopathy, lumbar region: Secondary | ICD-10-CM | POA: Diagnosis not present

## 2019-06-11 ENCOUNTER — Encounter (INDEPENDENT_AMBULATORY_CARE_PROVIDER_SITE_OTHER): Payer: Medicare HMO | Admitting: Ophthalmology

## 2019-06-11 DIAGNOSIS — H43813 Vitreous degeneration, bilateral: Secondary | ICD-10-CM

## 2019-06-11 DIAGNOSIS — E113313 Type 2 diabetes mellitus with moderate nonproliferative diabetic retinopathy with macular edema, bilateral: Secondary | ICD-10-CM | POA: Diagnosis not present

## 2019-06-11 DIAGNOSIS — E11311 Type 2 diabetes mellitus with unspecified diabetic retinopathy with macular edema: Secondary | ICD-10-CM

## 2019-06-14 DIAGNOSIS — E113299 Type 2 diabetes mellitus with mild nonproliferative diabetic retinopathy without macular edema, unspecified eye: Secondary | ICD-10-CM | POA: Diagnosis not present

## 2019-06-14 DIAGNOSIS — Z1389 Encounter for screening for other disorder: Secondary | ICD-10-CM | POA: Diagnosis not present

## 2019-06-14 DIAGNOSIS — Z8673 Personal history of transient ischemic attack (TIA), and cerebral infarction without residual deficits: Secondary | ICD-10-CM | POA: Diagnosis not present

## 2019-06-14 DIAGNOSIS — Z7984 Long term (current) use of oral hypoglycemic drugs: Secondary | ICD-10-CM | POA: Diagnosis not present

## 2019-06-14 DIAGNOSIS — Z0000 Encounter for general adult medical examination without abnormal findings: Secondary | ICD-10-CM | POA: Diagnosis not present

## 2019-06-14 DIAGNOSIS — E785 Hyperlipidemia, unspecified: Secondary | ICD-10-CM | POA: Diagnosis not present

## 2019-06-14 DIAGNOSIS — E890 Postprocedural hypothyroidism: Secondary | ICD-10-CM | POA: Diagnosis not present

## 2019-06-14 DIAGNOSIS — E1142 Type 2 diabetes mellitus with diabetic polyneuropathy: Secondary | ICD-10-CM | POA: Diagnosis not present

## 2019-06-14 DIAGNOSIS — I69351 Hemiplegia and hemiparesis following cerebral infarction affecting right dominant side: Secondary | ICD-10-CM | POA: Diagnosis not present

## 2019-06-17 ENCOUNTER — Ambulatory Visit: Payer: Medicare HMO | Attending: Internal Medicine

## 2019-06-17 DIAGNOSIS — E785 Hyperlipidemia, unspecified: Secondary | ICD-10-CM | POA: Diagnosis not present

## 2019-06-17 DIAGNOSIS — E890 Postprocedural hypothyroidism: Secondary | ICD-10-CM | POA: Diagnosis not present

## 2019-06-17 DIAGNOSIS — E1142 Type 2 diabetes mellitus with diabetic polyneuropathy: Secondary | ICD-10-CM | POA: Diagnosis not present

## 2019-06-17 DIAGNOSIS — D509 Iron deficiency anemia, unspecified: Secondary | ICD-10-CM | POA: Diagnosis not present

## 2019-06-17 DIAGNOSIS — Z23 Encounter for immunization: Secondary | ICD-10-CM

## 2019-06-17 NOTE — Progress Notes (Signed)
   Covid-19 Vaccination Clinic  Name:  Colleen Carroll    MRN: [id number] DOB: [birth year]  06/17/2019  [name] was observed post Covid-19 immunization for 15 minutes without incident. She was provided with Vaccine Information Sheet and instruction to access the V-Safe system.   [name] was instructed to call 911 with any severe reactions post vaccine: Marland Kitchen Difficulty breathing  . Swelling of face and throat  . A fast heartbeat  . A bad rash all over body  . Dizziness and weakness   Immunizations Administered    Name Date Dose VIS Date Route   Pfizer COVID-19 Vaccine 06/17/2019  9:01 AM 0.3 mL 04/21/2018 Intramuscular   Manufacturer: [place]   Lot: [id number]   [place]: [id number]

## 2019-07-04 DIAGNOSIS — S93411A Sprain of calcaneofibular ligament of right ankle, initial encounter: Secondary | ICD-10-CM | POA: Diagnosis not present

## 2019-07-08 DIAGNOSIS — E890 Postprocedural hypothyroidism: Secondary | ICD-10-CM | POA: Diagnosis not present

## 2019-07-08 DIAGNOSIS — E1142 Type 2 diabetes mellitus with diabetic polyneuropathy: Secondary | ICD-10-CM | POA: Diagnosis not present

## 2019-07-08 DIAGNOSIS — E785 Hyperlipidemia, unspecified: Secondary | ICD-10-CM | POA: Diagnosis not present

## 2019-07-08 DIAGNOSIS — D509 Iron deficiency anemia, unspecified: Secondary | ICD-10-CM | POA: Diagnosis not present

## 2019-07-08 DIAGNOSIS — E113293 Type 2 diabetes mellitus with mild nonproliferative diabetic retinopathy without macular edema, bilateral: Secondary | ICD-10-CM | POA: Diagnosis not present

## 2019-07-09 ENCOUNTER — Other Ambulatory Visit: Payer: Self-pay

## 2019-07-09 ENCOUNTER — Encounter (INDEPENDENT_AMBULATORY_CARE_PROVIDER_SITE_OTHER): Payer: Medicare HMO | Admitting: Ophthalmology

## 2019-07-09 DIAGNOSIS — E113313 Type 2 diabetes mellitus with moderate nonproliferative diabetic retinopathy with macular edema, bilateral: Secondary | ICD-10-CM

## 2019-07-09 DIAGNOSIS — E11311 Type 2 diabetes mellitus with unspecified diabetic retinopathy with macular edema: Secondary | ICD-10-CM

## 2019-07-09 DIAGNOSIS — H43813 Vitreous degeneration, bilateral: Secondary | ICD-10-CM | POA: Diagnosis not present

## 2019-07-12 ENCOUNTER — Ambulatory Visit: Payer: Medicare HMO | Attending: Internal Medicine

## 2019-07-12 DIAGNOSIS — Z23 Encounter for immunization: Secondary | ICD-10-CM

## 2019-07-12 NOTE — Progress Notes (Signed)
   Covid-19 Vaccination Clinic  Name:  Colleen Carroll    MRN: [id number] DOB: 1955-06-26  07/12/2019  Colleen Carroll was observed post Covid-19 immunization for 15 minutes without incident. She was provided with Vaccine Information Sheet and instruction to access the V-Safe system.   Colleen Carroll was instructed to call 911 with any severe reactions post vaccine: Marland Kitchen Difficulty breathing  . Swelling of face and throat  . A fast heartbeat  . A bad rash all over body  . Dizziness and weakness   Immunizations Administered    Name Date Dose VIS Date Route   Pfizer COVID-19 Vaccine 07/12/2019  8:53 AM 0.3 mL 04/21/2018 Intramuscular   Manufacturer: [place]   Lot: [id number]   [place]: [id number]

## 2019-07-19 DIAGNOSIS — M25571 Pain in right ankle and joints of right foot: Secondary | ICD-10-CM | POA: Diagnosis not present

## 2019-07-19 DIAGNOSIS — M25471 Effusion, right ankle: Secondary | ICD-10-CM | POA: Diagnosis not present

## 2019-07-19 DIAGNOSIS — S93491S Sprain of other ligament of right ankle, sequela: Secondary | ICD-10-CM | POA: Diagnosis not present

## 2019-08-06 ENCOUNTER — Encounter (INDEPENDENT_AMBULATORY_CARE_PROVIDER_SITE_OTHER): Payer: Medicare HMO | Admitting: Ophthalmology

## 2019-08-06 ENCOUNTER — Other Ambulatory Visit: Payer: Self-pay

## 2019-08-06 DIAGNOSIS — E113313 Type 2 diabetes mellitus with moderate nonproliferative diabetic retinopathy with macular edema, bilateral: Secondary | ICD-10-CM | POA: Diagnosis not present

## 2019-08-06 DIAGNOSIS — H43813 Vitreous degeneration, bilateral: Secondary | ICD-10-CM | POA: Diagnosis not present

## 2019-08-06 DIAGNOSIS — E11311 Type 2 diabetes mellitus with unspecified diabetic retinopathy with macular edema: Secondary | ICD-10-CM

## 2019-08-11 DIAGNOSIS — D509 Iron deficiency anemia, unspecified: Secondary | ICD-10-CM | POA: Diagnosis not present

## 2019-08-11 DIAGNOSIS — E890 Postprocedural hypothyroidism: Secondary | ICD-10-CM | POA: Diagnosis not present

## 2019-08-11 DIAGNOSIS — E559 Vitamin D deficiency, unspecified: Secondary | ICD-10-CM | POA: Diagnosis not present

## 2019-08-11 DIAGNOSIS — E1142 Type 2 diabetes mellitus with diabetic polyneuropathy: Secondary | ICD-10-CM | POA: Diagnosis not present

## 2019-08-11 DIAGNOSIS — R252 Cramp and spasm: Secondary | ICD-10-CM | POA: Diagnosis not present

## 2019-08-11 DIAGNOSIS — E785 Hyperlipidemia, unspecified: Secondary | ICD-10-CM | POA: Diagnosis not present

## 2019-08-11 DIAGNOSIS — M609 Myositis, unspecified: Secondary | ICD-10-CM | POA: Diagnosis not present

## 2019-09-03 ENCOUNTER — Other Ambulatory Visit: Payer: Self-pay

## 2019-09-03 ENCOUNTER — Encounter (INDEPENDENT_AMBULATORY_CARE_PROVIDER_SITE_OTHER): Payer: Medicare HMO | Admitting: Ophthalmology

## 2019-09-03 DIAGNOSIS — H43813 Vitreous degeneration, bilateral: Secondary | ICD-10-CM

## 2019-09-03 DIAGNOSIS — E11311 Type 2 diabetes mellitus with unspecified diabetic retinopathy with macular edema: Secondary | ICD-10-CM

## 2019-09-03 DIAGNOSIS — H2513 Age-related nuclear cataract, bilateral: Secondary | ICD-10-CM | POA: Diagnosis not present

## 2019-09-03 DIAGNOSIS — E113313 Type 2 diabetes mellitus with moderate nonproliferative diabetic retinopathy with macular edema, bilateral: Secondary | ICD-10-CM

## 2019-09-23 ENCOUNTER — Ambulatory Visit: Payer: Medicare HMO | Admitting: Podiatry

## 2019-09-23 ENCOUNTER — Telehealth: Payer: Self-pay | Admitting: *Deleted

## 2019-09-23 ENCOUNTER — Other Ambulatory Visit: Payer: Self-pay

## 2019-09-23 ENCOUNTER — Ambulatory Visit (INDEPENDENT_AMBULATORY_CARE_PROVIDER_SITE_OTHER): Payer: Medicare HMO

## 2019-09-23 ENCOUNTER — Encounter: Payer: Self-pay | Admitting: Podiatry

## 2019-09-23 DIAGNOSIS — S93401A Sprain of unspecified ligament of right ankle, initial encounter: Secondary | ICD-10-CM | POA: Diagnosis not present

## 2019-09-23 DIAGNOSIS — S99911A Unspecified injury of right ankle, initial encounter: Secondary | ICD-10-CM | POA: Diagnosis not present

## 2019-09-23 NOTE — Patient Instructions (Signed)
For instructions on how to put on your Tri-Lock Ankle Brace, please visit [email or web address] 

## 2019-09-23 NOTE — Telephone Encounter (Signed)
-----   Message from Trula Slade, DPM sent at 09/23/2019 10:11 AM EDT ----- Can you please order an MRI of the right ankle due to lateral ankle sprain in May and continued to have pain; rule out ATFL tear/avulsion fracture noted on x-ray

## 2019-09-24 NOTE — Progress Notes (Signed)
Subjective:   Patient ID: Colleen Carroll, female   DOB: 64 y.o.   MRN: [id number]   HPI 64 year old female presents the office with concerns of right ankle pain, lateral aspect after sustaining a sprain back in May.  She states that she had an injury while at [facility] approximately 2 to 3 years ago injuring her ankle and she has been doing physical therapy.  She then started to walk more during her normal walking she felt that her ankle gave out and she twisted her ankle.  Since then she has had swelling and discomfort in the lateral aspect and she feels that she is walking there is some pain on the inside of her ankle, point to the lateral aspect.  She was originally seen in urgent care and she follow-up with her primary care physician.  She has been wearing what seems to be an elastic brace which has not been helping.  No other injuries at the time.  No other concerns today.   Review of Systems  All other systems reviewed and are negative.  History reviewed. No pertinent past medical history.  Past Surgical History:  Procedure Laterality Date  . CARPAL TUNNEL RELEASE    . FOOT SURGERY Bilateral   . SHOULDER SURGERY Right      Current Outpatient Medications:  .  chlorhexidine (PERIDEX) 0.12 % solution, chlorhexidine gluconate 0.12 % mouthwash, Disp: , Rfl:  .  clopidogrel (PLAVIX) 75 MG tablet, clopidogrel 75 mg tablet  Take 1 tablet every day by oral route., Disp: , Rfl:  .  gabapentin (NEURONTIN) 300 MG capsule, gabapentin 300 mg capsule  Take 1 capsule 3 times a day by oral route., Disp: , Rfl:  .  glimepiride (AMARYL) 4 MG tablet, glimepiride 4 mg tablet, Disp: , Rfl:  .  metFORMIN (GLUCOPHAGE) 1000 MG tablet, , Disp: , Rfl:  .  omeprazole (PRILOSEC) 20 MG capsule, , Disp: , Rfl:  .  pioglitazone (ACTOS) 45 MG tablet, pioglitazone 45 mg tablet  Take 1 tablet every day by oral route., Disp: , Rfl:  .  simvastatin (ZOCOR) 40 MG tablet, , Disp: , Rfl:  .  trimethoprim-polymyxin b  (POLYTRIM) ophthalmic solution, , Disp: , Rfl:   No Known Allergies       Objective:  Physical Exam  General: AAO x3, NAD  Dermatological: Skin is warm, dry and supple bilateral.There are no open sores, no preulcerative lesions, no rash or signs of infection present.  Vascular: Dorsalis Pedis artery and Posterior Tibial artery pedal pulses are 2/4 bilateral with immedate capillary fill time. There is no pain with calf compression, swelling, warmth, erythema.   Neruologic: Grossly intact via light touch bilateral.  Sensation intact with Semmes Weinstein monofilament  Musculoskeletal: There is tenderness palpation lateral aspect of the right lateral ankle complex most of the ATFL and CFL there is localized edema.  Slight increase in anterior drawer compared to right lower extremity.  Talar tilt test is negative.  There is no area of pinpoint tenderness.  Flexor, extensor tendons appear to be intact.  Achilles tendon intact.  Muscular strength 5/5 in all groups tested bilateral.  Gait: Unassisted, Nonantalgic.       Assessment:   64 year old female right lateral ankle injury    Plan:  -Treatment options discussed including all alternatives, risks, and complications -Etiology of symptoms were discussed -X-rays were obtained and reviewed with the patient.  On the oblique view there is a small avulsion of the talus but no other  evidence of acute fracture identified.  This is likely chronic. -Tri-Lock ankle brace dispensed -At this point given prolonged nature of symptoms I recommend MRI of the right ankle to evaluate lateral ankle ligament integrity and to evaluate lateral ankle ligament tear.  -Ice elevation limit activity  Trula Slade DPM

## 2019-09-24 NOTE — Telephone Encounter (Signed)
Faxed orders to [facility] for pre-cert and scheduling. 

## 2019-09-24 NOTE — Telephone Encounter (Signed)
Note is done.  

## 2019-09-25 DIAGNOSIS — D509 Iron deficiency anemia, unspecified: Secondary | ICD-10-CM | POA: Diagnosis not present

## 2019-09-25 DIAGNOSIS — E785 Hyperlipidemia, unspecified: Secondary | ICD-10-CM | POA: Diagnosis not present

## 2019-09-25 DIAGNOSIS — E890 Postprocedural hypothyroidism: Secondary | ICD-10-CM | POA: Diagnosis not present

## 2019-09-25 DIAGNOSIS — E1142 Type 2 diabetes mellitus with diabetic polyneuropathy: Secondary | ICD-10-CM | POA: Diagnosis not present

## 2019-10-08 ENCOUNTER — Encounter (INDEPENDENT_AMBULATORY_CARE_PROVIDER_SITE_OTHER): Payer: Medicare HMO | Admitting: Ophthalmology

## 2019-10-08 ENCOUNTER — Other Ambulatory Visit: Payer: Self-pay

## 2019-10-08 DIAGNOSIS — E11311 Type 2 diabetes mellitus with unspecified diabetic retinopathy with macular edema: Secondary | ICD-10-CM | POA: Diagnosis not present

## 2019-10-08 DIAGNOSIS — E113313 Type 2 diabetes mellitus with moderate nonproliferative diabetic retinopathy with macular edema, bilateral: Secondary | ICD-10-CM

## 2019-10-08 DIAGNOSIS — H43813 Vitreous degeneration, bilateral: Secondary | ICD-10-CM

## 2019-10-21 ENCOUNTER — Ambulatory Visit
Admission: RE | Admit: 2019-10-21 | Discharge: 2019-10-21 | Disposition: A | Discharge status: Home / Self Care | Payer: Medicare HMO | Source: Ambulatory Visit | Attending: Podiatry | Admitting: Podiatry

## 2019-10-21 DIAGNOSIS — M25471 Effusion, right ankle: Secondary | ICD-10-CM | POA: Diagnosis not present

## 2019-10-21 DIAGNOSIS — M85671 Other cyst of bone, right ankle and foot: Secondary | ICD-10-CM | POA: Diagnosis not present

## 2019-10-21 DIAGNOSIS — R600 Localized edema: Secondary | ICD-10-CM | POA: Diagnosis not present

## 2019-11-02 ENCOUNTER — Telehealth: Payer: Self-pay | Admitting: Podiatry

## 2019-11-02 ENCOUNTER — Telehealth: Payer: Self-pay | Admitting: Podiatrist

## 2019-11-02 DIAGNOSIS — E785 Hyperlipidemia, unspecified: Secondary | ICD-10-CM | POA: Diagnosis not present

## 2019-11-02 DIAGNOSIS — E890 Postprocedural hypothyroidism: Secondary | ICD-10-CM | POA: Diagnosis not present

## 2019-11-02 DIAGNOSIS — E1142 Type 2 diabetes mellitus with diabetic polyneuropathy: Secondary | ICD-10-CM | POA: Diagnosis not present

## 2019-11-02 DIAGNOSIS — D509 Iron deficiency anemia, unspecified: Secondary | ICD-10-CM | POA: Diagnosis not present

## 2019-11-02 DIAGNOSIS — E113299 Type 2 diabetes mellitus with mild nonproliferative diabetic retinopathy without macular edema, unspecified eye: Secondary | ICD-10-CM | POA: Diagnosis not present

## 2019-11-02 NOTE — Telephone Encounter (Signed)
Patient called and left a vm this am stating you called her on Friday and she wants to know if you can call her back.    Thanks!

## 2019-11-02 NOTE — Telephone Encounter (Signed)
Pt called the office to return Dr Leigh Aurora call. She will be home for the rest of the day.

## 2019-11-03 ENCOUNTER — Ambulatory Visit (INDEPENDENT_AMBULATORY_CARE_PROVIDER_SITE_OTHER): Payer: Medicare HMO | Admitting: Podiatry

## 2019-11-03 ENCOUNTER — Other Ambulatory Visit: Payer: Self-pay

## 2019-11-03 DIAGNOSIS — S93401D Sprain of unspecified ligament of right ankle, subsequent encounter: Secondary | ICD-10-CM

## 2019-11-03 DIAGNOSIS — S92214A Nondisplaced fracture of cuboid bone of right foot, initial encounter for closed fracture: Secondary | ICD-10-CM

## 2019-11-03 NOTE — Telephone Encounter (Signed)
I called patient back. I reviewed the MRI with her and I have recommended her to wear a CAM boot. She is going to come by to pick it up this afternoon.

## 2019-11-03 NOTE — Patient Instructions (Signed)
Walking Boot, Adult    A walking boot is a medical device that holds your foot or ankle in place after an injury or a medical procedure. This helps with healing and prevents further injury. A walking boot is a removable boot-shaped splint. It has a hard, rigid outer frame that limits movement and supports your foot and leg. The inner lining is a layer of padded material. Walking boots usually have adjustable straps to secure them over the foot and leg.  Your health care provider may prescribe a walking boot if you can put weight (bear weight) on your injured foot. How much you can walk while wearing the boot will depend on the type and severity of your injury. Your health care provider will recommend the best boot for you based on your condition.  How do I put on my walking boot?  There are different types of walking boots. Each type of boot has specific instructions about how to wear it properly. Follow instructions from your health care provider about wearing your boot. In general:  · Ask someone to help you put on the boot, if needed.  · Sit to put on your boot. Doing this is more comfortable and it helps to prevent falls.  · Open up the boot fully. Place your foot into the boot so your heel rests against the back.  · Your toes should be supported by the base of the boot. They should not hang over the front edge.  · Adjust the straps so the boot fits securely but is not too tight.  · Do not bend the hard frame of the boot to get a good fit.  What are some tips for walking with a walking boot?  · Do not try to walk without wearing the boot unless your health care provider has approved.  · Use other assistive walking devices, including crutches and canes, as told by your health care provider.  · On your uninjured foot, wear a shoe with a heel that is close to the height of the walking boot.  · Be careful when walking on surfaces that are uneven or wet.  How can I reduce swelling while using a walking  boot?    · Rest your injured foot or leg as much as possible.  · If directed, apply ice to the injured area:  ? Put ice in a plastic bag.  ? Place a towel between your skin and the bag.  ? Leave the ice on for 20 minutes, 2-3 times a day.  · Keep your injured foot or leg raised (elevated) above the level of your heart whenever able. Try to do this for at least 2?3 hours each day or as told by your health care provider.  · If swelling gets worse, loosen the boot and rest and elevate your foot and leg.  How should I take care of my skin and foot while using a walking boot?  · Wear a long sock to protect your foot and leg from rubbing inside the boot.  · Take off the boot one time each day to check the injured area. Look at your foot, surrounding skin, and leg to make sure there are no sores, rashes, swelling, or wounds. The skin should be a healthy color, not pale or blue.  · Try to notice if your walking pattern (gait) in the boot is fairly normal and that you are not walking with a noticeable limp.  · Follow instructions from your health   your foot and leg before putting the boot back on. Are there any activity restrictions? Activity restrictions depend on the type and severity of your injury. Follow instructions from your health care provider about limiting activities.  Bathe and shower as told by your health care provider.  Do not do any activities that could make your injury worse.  Do not drive if your affected foot is one that you usually use for driving. When can I remove my walking boot? Always follow specific directions from your health care provider for removing the walking boot. Generally, it is okay to remove your walking boot:  At the end of the day when you are resting or sleeping.  To clean your foot and leg. How should I keep  my walking boot clean?  Do not put any part of the boot in a washing machine or dryer.  Do not use chemical cleaning products. These could irritate your skin, especially if you have a wound or an incision.  Do not soak the liner of the boot.  Use a washcloth with mild soap and water to clean the frame and the liner of the boot by hand.  Allow the boot to air-dry completely before you put it back on your foot. Contact a health care provider if:  The boot is cracked or damaged.  The boot does not fit properly.  Your foot or leg hurts.  You have a rash, sore, or open sore (ulcer) on your foot or leg.  The skin on your foot or leg is pale.  You have a wound or incision on the foot and it is getting worse.  Your skin becomes painful, red, or irritated.  Your swelling does not get better or it gets worse. Get help right away if:  You cannot feel your foot or leg (have numbness).  You cannot feel a pulse at the top of your foot, where your foot and ankle meet.  Your skin on the foot or leg is cold, blue, or gray. Summary  A walking boot holds your foot or ankle in place after an injury or a medical procedure.  There are different types of walking boots. Follow the specific instructions about how to correctly wear the boot that you have.  Ask someone to help you put on the boot, if needed.  It is important to check your skin and foot every day. Call your health care provider if you notice a rash or sore on your foot or leg. This information is not intended to replace advice given to you by your health care provider. Make sure you discuss any questions you have with your health care provider. Document Revised: 06/02/2018 Document Reviewed: 03/21/2016 Elsevier Patient Education  2020 [employer].

## 2019-11-05 ENCOUNTER — Other Ambulatory Visit: Payer: Self-pay

## 2019-11-05 ENCOUNTER — Encounter (INDEPENDENT_AMBULATORY_CARE_PROVIDER_SITE_OTHER): Payer: Medicare HMO | Admitting: Ophthalmology

## 2019-11-05 DIAGNOSIS — E11311 Type 2 diabetes mellitus with unspecified diabetic retinopathy with macular edema: Secondary | ICD-10-CM

## 2019-11-05 DIAGNOSIS — E113313 Type 2 diabetes mellitus with moderate nonproliferative diabetic retinopathy with macular edema, bilateral: Secondary | ICD-10-CM | POA: Diagnosis not present

## 2019-11-05 DIAGNOSIS — H43813 Vitreous degeneration, bilateral: Secondary | ICD-10-CM | POA: Diagnosis not present

## 2019-11-16 ENCOUNTER — Other Ambulatory Visit: Payer: Self-pay | Admitting: Internal Medicine

## 2019-11-16 DIAGNOSIS — Z1231 Encounter for screening mammogram for malignant neoplasm of breast: Secondary | ICD-10-CM

## 2019-11-17 NOTE — Progress Notes (Signed)
I called the patient earlier today to go over the MRI results.  After discussion of the results and still having discomfort recommend normalization in cam boot.  She presented to the office to pick up a cam boot.  This was fitted for her today.  I will see her back in 1 month for further evaluation or sooner if any issues are to arise.  ----  MRI Both the calcaneofibular and anterior talofibular ligaments are thickened with intermediate increased T2 signal consistent with sprain but the ligaments appear intact.  Edema and mild concavity of the calcaneus at the calcaneocuboid joint is likely due to fracture of the subchondral bone plate. Osteoarthritis is less likely given absence of signal change in the cuboid.  Mild edema and a tiny cyst in the lateral malleolus may be due to contusion and ligament sprain.

## 2019-12-09 ENCOUNTER — Encounter (INDEPENDENT_AMBULATORY_CARE_PROVIDER_SITE_OTHER): Payer: Medicare HMO | Admitting: Ophthalmology

## 2019-12-09 ENCOUNTER — Other Ambulatory Visit: Payer: Self-pay

## 2019-12-09 DIAGNOSIS — E11311 Type 2 diabetes mellitus with unspecified diabetic retinopathy with macular edema: Secondary | ICD-10-CM | POA: Diagnosis not present

## 2019-12-09 DIAGNOSIS — H43813 Vitreous degeneration, bilateral: Secondary | ICD-10-CM

## 2019-12-09 DIAGNOSIS — E113313 Type 2 diabetes mellitus with moderate nonproliferative diabetic retinopathy with macular edema, bilateral: Secondary | ICD-10-CM

## 2019-12-14 DIAGNOSIS — E1142 Type 2 diabetes mellitus with diabetic polyneuropathy: Secondary | ICD-10-CM | POA: Diagnosis not present

## 2019-12-14 DIAGNOSIS — E11319 Type 2 diabetes mellitus with unspecified diabetic retinopathy without macular edema: Secondary | ICD-10-CM | POA: Diagnosis not present

## 2019-12-14 DIAGNOSIS — Z7984 Long term (current) use of oral hypoglycemic drugs: Secondary | ICD-10-CM | POA: Diagnosis not present

## 2019-12-14 DIAGNOSIS — R030 Elevated blood-pressure reading, without diagnosis of hypertension: Secondary | ICD-10-CM | POA: Diagnosis not present

## 2019-12-14 DIAGNOSIS — E785 Hyperlipidemia, unspecified: Secondary | ICD-10-CM | POA: Diagnosis not present

## 2019-12-14 DIAGNOSIS — Z23 Encounter for immunization: Secondary | ICD-10-CM | POA: Diagnosis not present

## 2019-12-16 ENCOUNTER — Ambulatory Visit: Payer: Medicare HMO | Admitting: Podiatry

## 2019-12-16 ENCOUNTER — Other Ambulatory Visit: Payer: Self-pay

## 2019-12-16 DIAGNOSIS — G8929 Other chronic pain: Secondary | ICD-10-CM

## 2019-12-16 DIAGNOSIS — M25571 Pain in right ankle and joints of right foot: Secondary | ICD-10-CM

## 2019-12-16 DIAGNOSIS — S92214D Nondisplaced fracture of cuboid bone of right foot, subsequent encounter for fracture with routine healing: Secondary | ICD-10-CM | POA: Diagnosis not present

## 2019-12-16 DIAGNOSIS — S93401D Sprain of unspecified ligament of right ankle, subsequent encounter: Secondary | ICD-10-CM | POA: Diagnosis not present

## 2019-12-21 ENCOUNTER — Other Ambulatory Visit: Payer: Self-pay

## 2019-12-21 DIAGNOSIS — S93401D Sprain of unspecified ligament of right ankle, subsequent encounter: Secondary | ICD-10-CM

## 2019-12-21 NOTE — Progress Notes (Signed)
Subjective: 64 year old female presents the office today for follow-up evaluation of ankle pain.  She states that she sustained a fall while at [facility] couple years ago and since then she said continued discomfort.  I briefly discussed the MRI results with her and she has been immobilized in a cam boot.  She points today along the anterior lateral aspect ankle she has majority discomfort as well as swelling. Denies any systemic complaints such as fevers, chills, nausea, vomiting. No acute changes since last appointment, and no other complaints at this time.   Objective: AAO x3, NAD DP/PT pulses palpable bilaterally, CRT less than 3 seconds Majority of discomfort is on the anterior lateral aspect of the ankle joint is localized edema to this area.  Tenderness on the ATFL, CFL but there is no gross ankle instability identified today.  There is no pain on the peroneal tendon.  There is no significant pain on the cuboid, fifth metatarsal base.  MMT 5/5.  No pain with calf compression, swelling, warmth, erythema  Assessment: Chronic ankle sprain  Plan: -All treatment options discussed with the patient including all alternatives, risks, complications.  -Patient review the MRI with her appears that there is no pain on the cuboid injury discomfort on the ankle joint.  This point I will order physical therapy today through [facility].  Continue the cam boot for now but as she starts therapy she can start to transition back into regular's, supportive shoe.  She wants to have surgery but I would recommend holding off on that at this time. -Patient encouraged to call the office with any questions, concerns, change in symptoms.   Trula Slade DPM

## 2019-12-22 DIAGNOSIS — E785 Hyperlipidemia, unspecified: Secondary | ICD-10-CM | POA: Diagnosis not present

## 2019-12-22 DIAGNOSIS — E1142 Type 2 diabetes mellitus with diabetic polyneuropathy: Secondary | ICD-10-CM | POA: Diagnosis not present

## 2019-12-27 ENCOUNTER — Other Ambulatory Visit: Payer: Self-pay

## 2019-12-27 ENCOUNTER — Ambulatory Visit
Admission: RE | Admit: 2019-12-27 | Discharge: 2019-12-27 | Disposition: A | Discharge status: Home / Self Care | Payer: Medicare HMO | Source: Ambulatory Visit | Attending: Internal Medicine | Admitting: Internal Medicine

## 2019-12-27 DIAGNOSIS — Z1231 Encounter for screening mammogram for malignant neoplasm of breast: Secondary | ICD-10-CM

## 2019-12-29 DIAGNOSIS — R269 Unspecified abnormalities of gait and mobility: Secondary | ICD-10-CM | POA: Diagnosis not present

## 2019-12-29 DIAGNOSIS — M25571 Pain in right ankle and joints of right foot: Secondary | ICD-10-CM | POA: Diagnosis not present

## 2019-12-29 DIAGNOSIS — E118 Type 2 diabetes mellitus with unspecified complications: Secondary | ICD-10-CM | POA: Diagnosis not present

## 2019-12-29 DIAGNOSIS — M25671 Stiffness of right ankle, not elsewhere classified: Secondary | ICD-10-CM | POA: Diagnosis not present

## 2019-12-29 DIAGNOSIS — M25471 Effusion, right ankle: Secondary | ICD-10-CM | POA: Diagnosis not present

## 2019-12-29 DIAGNOSIS — M62571 Muscle wasting and atrophy, not elsewhere classified, right ankle and foot: Secondary | ICD-10-CM | POA: Diagnosis not present

## 2019-12-31 DIAGNOSIS — M25671 Stiffness of right ankle, not elsewhere classified: Secondary | ICD-10-CM | POA: Diagnosis not present

## 2019-12-31 DIAGNOSIS — R269 Unspecified abnormalities of gait and mobility: Secondary | ICD-10-CM | POA: Diagnosis not present

## 2019-12-31 DIAGNOSIS — M62571 Muscle wasting and atrophy, not elsewhere classified, right ankle and foot: Secondary | ICD-10-CM | POA: Diagnosis not present

## 2019-12-31 DIAGNOSIS — E118 Type 2 diabetes mellitus with unspecified complications: Secondary | ICD-10-CM | POA: Diagnosis not present

## 2019-12-31 DIAGNOSIS — M25571 Pain in right ankle and joints of right foot: Secondary | ICD-10-CM | POA: Diagnosis not present

## 2019-12-31 DIAGNOSIS — M25471 Effusion, right ankle: Secondary | ICD-10-CM | POA: Diagnosis not present

## 2020-01-06 DIAGNOSIS — M25471 Effusion, right ankle: Secondary | ICD-10-CM | POA: Diagnosis not present

## 2020-01-06 DIAGNOSIS — R269 Unspecified abnormalities of gait and mobility: Secondary | ICD-10-CM | POA: Diagnosis not present

## 2020-01-06 DIAGNOSIS — M25671 Stiffness of right ankle, not elsewhere classified: Secondary | ICD-10-CM | POA: Diagnosis not present

## 2020-01-06 DIAGNOSIS — E118 Type 2 diabetes mellitus with unspecified complications: Secondary | ICD-10-CM | POA: Diagnosis not present

## 2020-01-06 DIAGNOSIS — M62571 Muscle wasting and atrophy, not elsewhere classified, right ankle and foot: Secondary | ICD-10-CM | POA: Diagnosis not present

## 2020-01-06 DIAGNOSIS — M25571 Pain in right ankle and joints of right foot: Secondary | ICD-10-CM | POA: Diagnosis not present

## 2020-01-07 DIAGNOSIS — M25471 Effusion, right ankle: Secondary | ICD-10-CM | POA: Diagnosis not present

## 2020-01-07 DIAGNOSIS — M25571 Pain in right ankle and joints of right foot: Secondary | ICD-10-CM | POA: Diagnosis not present

## 2020-01-07 DIAGNOSIS — E118 Type 2 diabetes mellitus with unspecified complications: Secondary | ICD-10-CM | POA: Diagnosis not present

## 2020-01-07 DIAGNOSIS — R269 Unspecified abnormalities of gait and mobility: Secondary | ICD-10-CM | POA: Diagnosis not present

## 2020-01-07 DIAGNOSIS — M25671 Stiffness of right ankle, not elsewhere classified: Secondary | ICD-10-CM | POA: Diagnosis not present

## 2020-01-07 DIAGNOSIS — M62571 Muscle wasting and atrophy, not elsewhere classified, right ankle and foot: Secondary | ICD-10-CM | POA: Diagnosis not present

## 2020-01-14 ENCOUNTER — Encounter (INDEPENDENT_AMBULATORY_CARE_PROVIDER_SITE_OTHER): Payer: Medicare HMO | Admitting: Ophthalmology

## 2020-01-14 DIAGNOSIS — H43813 Vitreous degeneration, bilateral: Secondary | ICD-10-CM

## 2020-01-14 DIAGNOSIS — E11311 Type 2 diabetes mellitus with unspecified diabetic retinopathy with macular edema: Secondary | ICD-10-CM

## 2020-01-14 DIAGNOSIS — E113313 Type 2 diabetes mellitus with moderate nonproliferative diabetic retinopathy with macular edema, bilateral: Secondary | ICD-10-CM

## 2020-01-14 DIAGNOSIS — H4312 Vitreous hemorrhage, left eye: Secondary | ICD-10-CM

## 2020-01-17 DIAGNOSIS — E119 Type 2 diabetes mellitus without complications: Secondary | ICD-10-CM | POA: Diagnosis not present

## 2020-01-18 DIAGNOSIS — Z7984 Long term (current) use of oral hypoglycemic drugs: Secondary | ICD-10-CM | POA: Diagnosis not present

## 2020-01-18 DIAGNOSIS — E1165 Type 2 diabetes mellitus with hyperglycemia: Secondary | ICD-10-CM | POA: Diagnosis not present

## 2020-01-19 ENCOUNTER — Encounter: Payer: Self-pay | Admitting: Neurology

## 2020-01-19 DIAGNOSIS — Z7984 Long term (current) use of oral hypoglycemic drugs: Secondary | ICD-10-CM | POA: Diagnosis not present

## 2020-01-19 DIAGNOSIS — E785 Hyperlipidemia, unspecified: Secondary | ICD-10-CM | POA: Diagnosis not present

## 2020-01-19 DIAGNOSIS — R200 Anesthesia of skin: Secondary | ICD-10-CM | POA: Diagnosis not present

## 2020-01-19 DIAGNOSIS — R202 Paresthesia of skin: Secondary | ICD-10-CM | POA: Diagnosis not present

## 2020-01-19 DIAGNOSIS — E1165 Type 2 diabetes mellitus with hyperglycemia: Secondary | ICD-10-CM | POA: Diagnosis not present

## 2020-01-19 DIAGNOSIS — E890 Postprocedural hypothyroidism: Secondary | ICD-10-CM | POA: Diagnosis not present

## 2020-01-19 DIAGNOSIS — E11319 Type 2 diabetes mellitus with unspecified diabetic retinopathy without macular edema: Secondary | ICD-10-CM | POA: Diagnosis not present

## 2020-01-19 DIAGNOSIS — E113292 Type 2 diabetes mellitus with mild nonproliferative diabetic retinopathy without macular edema, left eye: Secondary | ICD-10-CM | POA: Diagnosis not present

## 2020-01-19 DIAGNOSIS — E1142 Type 2 diabetes mellitus with diabetic polyneuropathy: Secondary | ICD-10-CM | POA: Diagnosis not present

## 2020-01-19 DIAGNOSIS — D509 Iron deficiency anemia, unspecified: Secondary | ICD-10-CM | POA: Diagnosis not present

## 2020-02-02 ENCOUNTER — Other Ambulatory Visit: Payer: Self-pay

## 2020-02-02 DIAGNOSIS — R202 Paresthesia of skin: Secondary | ICD-10-CM

## 2020-02-03 ENCOUNTER — Ambulatory Visit: Payer: Medicare HMO | Admitting: Podiatry

## 2020-02-03 ENCOUNTER — Other Ambulatory Visit: Payer: Self-pay

## 2020-02-03 DIAGNOSIS — G629 Polyneuropathy, unspecified: Secondary | ICD-10-CM

## 2020-02-03 DIAGNOSIS — S92214D Nondisplaced fracture of cuboid bone of right foot, subsequent encounter for fracture with routine healing: Secondary | ICD-10-CM | POA: Diagnosis not present

## 2020-02-03 DIAGNOSIS — S93401D Sprain of unspecified ligament of right ankle, subsequent encounter: Secondary | ICD-10-CM | POA: Diagnosis not present

## 2020-02-04 DIAGNOSIS — M62571 Muscle wasting and atrophy, not elsewhere classified, right ankle and foot: Secondary | ICD-10-CM | POA: Diagnosis not present

## 2020-02-04 DIAGNOSIS — M25571 Pain in right ankle and joints of right foot: Secondary | ICD-10-CM | POA: Diagnosis not present

## 2020-02-04 DIAGNOSIS — R269 Unspecified abnormalities of gait and mobility: Secondary | ICD-10-CM | POA: Diagnosis not present

## 2020-02-04 DIAGNOSIS — E118 Type 2 diabetes mellitus with unspecified complications: Secondary | ICD-10-CM | POA: Diagnosis not present

## 2020-02-04 DIAGNOSIS — M25471 Effusion, right ankle: Secondary | ICD-10-CM | POA: Diagnosis not present

## 2020-02-04 DIAGNOSIS — M25671 Stiffness of right ankle, not elsewhere classified: Secondary | ICD-10-CM | POA: Diagnosis not present

## 2020-02-04 NOTE — Progress Notes (Signed)
Subjective: 64 year old female presents the office today for follow-up evaluation of ankle pain.  She states overall she is feeling better.  She is still doing physical therapy but she states that she is likely going to stop after her next treatment due to financial constraints.  Overall the foot is doing better she only gets some discomfort if she turns her foot a certain direction.  No swelling.  No redness or warmth.  She has no other concerns today.    Objective: AAO x3, NAD DP/PT pulses palpable bilaterally, CRT less than 3 seconds There is minimal discomfort to the lateral aspect of the ankle and minimal edema.  Overall the tenderness is much improved.  There is no gross ankle instability present.  On today's exam however she is not able to dorsiflex or plantarflex bilaterally at her ankles.  She has decreased muscle strength bilaterally which seems to be new.  She also reports that she showed progressive numbness and tingling to her feet and hands. Minimal edema to the lateral aspect left ankle without any pain associated with this.  No pain with calf compression, swelling, warmth, erythema  Assessment: Chronic ankle sprain-neuropathy, decreased muscle strength  Plan: -All treatment options discussed with the patient including all alternatives, risks, complications.  -From an ankle perspective she is doing better.  I want her to continue physical therapy.  Continue brace as needed on the right side.  Continue supportive shoes. -Concern for the decreased muscle strength bilaterally as well as progressive numbness and tingling to her feet and hands.  She is already scheduled to follow-up with neurology.  Trula Slade DPM

## 2020-02-09 DIAGNOSIS — E119 Type 2 diabetes mellitus without complications: Secondary | ICD-10-CM | POA: Diagnosis not present

## 2020-02-11 ENCOUNTER — Other Ambulatory Visit: Payer: Self-pay

## 2020-02-11 ENCOUNTER — Encounter (INDEPENDENT_AMBULATORY_CARE_PROVIDER_SITE_OTHER): Payer: Medicare HMO | Admitting: Ophthalmology

## 2020-02-11 DIAGNOSIS — E113313 Type 2 diabetes mellitus with moderate nonproliferative diabetic retinopathy with macular edema, bilateral: Secondary | ICD-10-CM

## 2020-02-11 DIAGNOSIS — H43813 Vitreous degeneration, bilateral: Secondary | ICD-10-CM | POA: Diagnosis not present

## 2020-02-21 DIAGNOSIS — D509 Iron deficiency anemia, unspecified: Secondary | ICD-10-CM | POA: Diagnosis not present

## 2020-02-21 DIAGNOSIS — E890 Postprocedural hypothyroidism: Secondary | ICD-10-CM | POA: Diagnosis not present

## 2020-02-21 DIAGNOSIS — E785 Hyperlipidemia, unspecified: Secondary | ICD-10-CM | POA: Diagnosis not present

## 2020-02-21 DIAGNOSIS — E1165 Type 2 diabetes mellitus with hyperglycemia: Secondary | ICD-10-CM | POA: Diagnosis not present

## 2020-02-21 DIAGNOSIS — E11319 Type 2 diabetes mellitus with unspecified diabetic retinopathy without macular edema: Secondary | ICD-10-CM | POA: Diagnosis not present

## 2020-02-21 DIAGNOSIS — E1142 Type 2 diabetes mellitus with diabetic polyneuropathy: Secondary | ICD-10-CM | POA: Diagnosis not present

## 2020-03-07 ENCOUNTER — Other Ambulatory Visit: Payer: Self-pay

## 2020-03-07 ENCOUNTER — Ambulatory Visit: Payer: Medicare HMO | Admitting: Neurology

## 2020-03-07 DIAGNOSIS — R202 Paresthesia of skin: Secondary | ICD-10-CM | POA: Diagnosis not present

## 2020-03-07 DIAGNOSIS — G5602 Carpal tunnel syndrome, left upper limb: Secondary | ICD-10-CM

## 2020-03-07 NOTE — Procedures (Signed)
[facility]  [place], [address]  [place], [place] [address] Tel: [phone] Fax:  [phone] Test Date:  03/07/2020  Patient: Colleen Carroll DOB: [birth year] Physician: Narda Amber, DO  Sex: Female Height: 5\' 6"  Ref Phys: Leeroy Cha, MD  ID#: [id number]   Technician:    Patient Complaints: This is a 65 year old female with diabetes mellitus referred for evaluation of generalized numbness and tingling to evaluate for neuropathy.  NCV & EMG Findings: Extensive electrodiagnostic testing of the right upper extremity and additional studies of the left shows:  1. Left median sensory response shows prolonged latency (4.3 ms) and normal amplitude.  Left ulnar, sural, and superficial peroneal sensory responses are within normal limits. 2. Left median motor response shows prolonged latency (4.1 ms).  Left ulnar, peroneal, and tibial motor responses are within normal limits. 3. Left tibial H refill studies within normal limits. 4. There is no evidence of active or chronic motor axonal loss changes affecting any of the tested muscles in the left upper extremity. 5. In the left lower extremity, there is a variable recruitment pattern seen by incomplete motor unit activation, these findings may be due to poor effort, pain, less likely central disorder of motor unit control.  Impression: 1. 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   2. There is no evidence of a sensorimotor polyneuropathy or cervical/lumbosacral radiculopathy.    ___________________________ Narda Amber, DO    Nerve Conduction Studies Anti Sensory Summary Table   Stim Site NR Peak (ms) Norm Peak (ms) P-T Amp (V) Norm P-T Amp  Left Median Anti Sensory (2nd Digit)  33C  Wrist    4.3 <3.8 24.4 >10  Left Sup Peroneal Anti Sensory (Ant Lat Mall)  33C  12 cm    2.8 <4.6 5.3 >3  Left Sural Anti Sensory (Lat Mall)   33C  Calf    4.1 <4.6 14.2 >3  Left Ulnar Anti Sensory (5th Digit)  33C  Wrist    3.1 <3.2 26.8 >5   Motor Summary Table   Stim Site NR Onset (ms) Norm Onset (ms) O-P Amp (mV) Norm O-P Amp Site1 Site2 Delta-0 (ms) Dist (cm) Vel (m/s) Norm Vel (m/s)  Left Median Motor (Abd Poll Brev)  33C  Wrist    4.1 <4.0 12.7 >5 Elbow Wrist 5.0 30.0 60 >50  Elbow    9.1  12.2         Left Peroneal Motor (Ext Dig Brev)  33C  Ankle    3.4 <6.0 4.8 >2.5 B Fib Ankle 7.3 36.0 49 >40  B Fib    10.7  3.8  Poplt B Fib 1.4 8.0 57 >40  Poplt    12.1  3.7         Left Tibial Motor (Abd Hall Brev)  33C  Ankle    4.9 <6.0 7.5 >4 Knee Ankle 6.9 42.0 61 >40  Knee    11.8  4.8         Left Ulnar Motor (Abd Dig Minimi)  33C  Wrist    2.3 <3.1 10.4 >7 B Elbow Wrist 3.7 23.0 62 >50  B Elbow    6.0  9.2  A Elbow B Elbow 1.7 10.0 59 >50  A Elbow    7.7  8.8          H Reflex Studies   NR H-Lat (ms) Lat Norm (ms) L-R H-Lat (ms)  Left Tibial (Gastroc)  33C     33.47 <35    EMG   Side Muscle Ins Act Fibs Psw Fasc Number Recrt Dur Dur. Amp Amp. Poly Poly. Comment  Left 1stDorInt Nml Nml Nml Nml Nml Nml Nml Nml Nml Nml Nml Nml N/A  Left AntTibialis Nml Nml Nml Nml 1- Mod-V Nml Nml Nml Nml Nml Nml N/A  Left Gastroc Nml Nml Nml Nml 1- Mod-V Nml Nml Nml Nml Nml Nml N/A  Left Flex Dig Long Nml Nml Nml Nml 2- Mod-V Nml Nml Nml Nml Nml Nml N/A  Left RectFemoris Nml Nml Nml Nml Nml Nml Nml Nml Nml Nml Nml Nml N/A  Left GluteusMed Nml Nml Nml Nml Nml Nml Nml Nml Nml Nml Nml Nml N/A  Left Abd Poll Brev Nml Nml Nml Nml Nml Nml Nml Nml Nml Nml Nml Nml N/A  Left PronatorTeres Nml Nml Nml Nml Nml Nml Nml Nml Nml Nml Nml Nml N/A  Left Biceps Nml Nml Nml Nml Nml Nml Nml Nml Nml Nml Nml Nml N/A  Left Triceps Nml Nml Nml Nml Nml Nml Nml Nml Nml Nml Nml Nml N/A  Left Deltoid Nml Nml Nml Nml Nml Nml Nml Nml Nml Nml Nml Nml N/A  Left BicepsFemS Nml Nml Nml Nml Nml Nml Nml Nml Nml Nml Nml Nml N/A      Waveforms:

## 2020-03-13 DIAGNOSIS — E119 Type 2 diabetes mellitus without complications: Secondary | ICD-10-CM | POA: Diagnosis not present

## 2020-03-16 ENCOUNTER — Encounter (INDEPENDENT_AMBULATORY_CARE_PROVIDER_SITE_OTHER): Payer: Medicare HMO | Admitting: Ophthalmology

## 2020-03-16 ENCOUNTER — Other Ambulatory Visit: Payer: Self-pay

## 2020-03-16 DIAGNOSIS — H4313 Vitreous hemorrhage, bilateral: Secondary | ICD-10-CM

## 2020-03-16 DIAGNOSIS — E113313 Type 2 diabetes mellitus with moderate nonproliferative diabetic retinopathy with macular edema, bilateral: Secondary | ICD-10-CM

## 2020-03-16 DIAGNOSIS — H43813 Vitreous degeneration, bilateral: Secondary | ICD-10-CM | POA: Diagnosis not present

## 2020-03-16 DIAGNOSIS — H2513 Age-related nuclear cataract, bilateral: Secondary | ICD-10-CM | POA: Diagnosis not present

## 2020-03-17 ENCOUNTER — Encounter (INDEPENDENT_AMBULATORY_CARE_PROVIDER_SITE_OTHER): Payer: Medicare HMO | Admitting: Ophthalmology

## 2020-03-20 ENCOUNTER — Ambulatory Visit: Payer: Medicare HMO | Admitting: Podiatry

## 2020-03-27 DIAGNOSIS — E785 Hyperlipidemia, unspecified: Secondary | ICD-10-CM | POA: Diagnosis not present

## 2020-03-27 DIAGNOSIS — E1142 Type 2 diabetes mellitus with diabetic polyneuropathy: Secondary | ICD-10-CM | POA: Diagnosis not present

## 2020-03-27 DIAGNOSIS — D509 Iron deficiency anemia, unspecified: Secondary | ICD-10-CM | POA: Diagnosis not present

## 2020-03-27 DIAGNOSIS — E1165 Type 2 diabetes mellitus with hyperglycemia: Secondary | ICD-10-CM | POA: Diagnosis not present

## 2020-03-27 DIAGNOSIS — E11319 Type 2 diabetes mellitus with unspecified diabetic retinopathy without macular edema: Secondary | ICD-10-CM | POA: Diagnosis not present

## 2020-03-27 DIAGNOSIS — E890 Postprocedural hypothyroidism: Secondary | ICD-10-CM | POA: Diagnosis not present

## 2020-04-10 ENCOUNTER — Other Ambulatory Visit: Payer: Self-pay

## 2020-04-10 ENCOUNTER — Ambulatory Visit: Payer: Medicare HMO | Admitting: Podiatry

## 2020-04-10 DIAGNOSIS — S92214D Nondisplaced fracture of cuboid bone of right foot, subsequent encounter for fracture with routine healing: Secondary | ICD-10-CM | POA: Diagnosis not present

## 2020-04-10 DIAGNOSIS — S93401D Sprain of unspecified ligament of right ankle, subsequent encounter: Secondary | ICD-10-CM

## 2020-04-12 NOTE — Progress Notes (Signed)
Subjective: 65 year old female presents the office today for follow-up evaluation of her right ankle pain.  She states that overall she is doing much better and she is increased her walking.  She states that she has minimal discomfort she turns her ankle a certain direction but overall much improved.  She has no new concerns today.  Denies any injury or falls or changes otherwise. Denies any systemic complaints such as fevers, chills, nausea, vomiting. No acute changes since last appointment, and no other complaints at this time.   Objective: AAO x3, NAD DP/PT pulses palpable bilaterally, CRT less than 3 seconds Not able to elicit any area of pinpoint tenderness.  Flexor, extensor tendons appear to be intact.  There is no gross ankle instability present.  Strength appears to be improved today as well.  There is no significant edema there is no erythema or warmth.  No pain with calf compression, swelling, warmth, erythema  Assessment: Chronic right ankle sprain with improvement  Plan: -All treatment options discussed with the patient including all alternatives, risks, complications. -Overall doing much better.  There is no significant discomfort today and she is increased her walking.  Discussed with her continue with supportive shoes and to gradually increase activity level as tolerated.  As she is doing better I will see her back as needed but if symptoms recur or worsen to let me know and she agrees. -Patient encouraged to call the office with any questions, concerns, change in symptoms.   Trula Slade DPM

## 2020-04-13 DIAGNOSIS — E1142 Type 2 diabetes mellitus with diabetic polyneuropathy: Secondary | ICD-10-CM | POA: Diagnosis not present

## 2020-04-13 DIAGNOSIS — E890 Postprocedural hypothyroidism: Secondary | ICD-10-CM | POA: Diagnosis not present

## 2020-04-13 DIAGNOSIS — E1165 Type 2 diabetes mellitus with hyperglycemia: Secondary | ICD-10-CM | POA: Diagnosis not present

## 2020-04-13 DIAGNOSIS — E785 Hyperlipidemia, unspecified: Secondary | ICD-10-CM | POA: Diagnosis not present

## 2020-04-13 DIAGNOSIS — D509 Iron deficiency anemia, unspecified: Secondary | ICD-10-CM | POA: Diagnosis not present

## 2020-04-13 DIAGNOSIS — E11319 Type 2 diabetes mellitus with unspecified diabetic retinopathy without macular edema: Secondary | ICD-10-CM | POA: Diagnosis not present

## 2020-04-14 ENCOUNTER — Other Ambulatory Visit: Payer: Self-pay

## 2020-04-14 ENCOUNTER — Encounter (INDEPENDENT_AMBULATORY_CARE_PROVIDER_SITE_OTHER): Payer: Medicare HMO | Admitting: Ophthalmology

## 2020-04-14 DIAGNOSIS — E113313 Type 2 diabetes mellitus with moderate nonproliferative diabetic retinopathy with macular edema, bilateral: Secondary | ICD-10-CM

## 2020-04-14 DIAGNOSIS — H43813 Vitreous degeneration, bilateral: Secondary | ICD-10-CM | POA: Diagnosis not present

## 2020-04-17 DIAGNOSIS — E119 Type 2 diabetes mellitus without complications: Secondary | ICD-10-CM | POA: Diagnosis not present

## 2020-04-19 DIAGNOSIS — Z7984 Long term (current) use of oral hypoglycemic drugs: Secondary | ICD-10-CM | POA: Diagnosis not present

## 2020-04-19 DIAGNOSIS — Z7982 Long term (current) use of aspirin: Secondary | ICD-10-CM | POA: Diagnosis not present

## 2020-04-19 DIAGNOSIS — E1165 Type 2 diabetes mellitus with hyperglycemia: Secondary | ICD-10-CM | POA: Diagnosis not present

## 2020-04-25 DIAGNOSIS — E119 Type 2 diabetes mellitus without complications: Secondary | ICD-10-CM | POA: Diagnosis not present

## 2020-05-09 DIAGNOSIS — E11319 Type 2 diabetes mellitus with unspecified diabetic retinopathy without macular edema: Secondary | ICD-10-CM | POA: Diagnosis not present

## 2020-05-09 DIAGNOSIS — E785 Hyperlipidemia, unspecified: Secondary | ICD-10-CM | POA: Diagnosis not present

## 2020-05-09 DIAGNOSIS — E1165 Type 2 diabetes mellitus with hyperglycemia: Secondary | ICD-10-CM | POA: Diagnosis not present

## 2020-05-09 DIAGNOSIS — E890 Postprocedural hypothyroidism: Secondary | ICD-10-CM | POA: Diagnosis not present

## 2020-05-09 DIAGNOSIS — E1142 Type 2 diabetes mellitus with diabetic polyneuropathy: Secondary | ICD-10-CM | POA: Diagnosis not present

## 2020-05-09 DIAGNOSIS — D509 Iron deficiency anemia, unspecified: Secondary | ICD-10-CM | POA: Diagnosis not present

## 2020-05-18 ENCOUNTER — Encounter (INDEPENDENT_AMBULATORY_CARE_PROVIDER_SITE_OTHER): Payer: Medicare HMO | Admitting: Ophthalmology

## 2020-05-18 ENCOUNTER — Other Ambulatory Visit: Payer: Self-pay

## 2020-05-18 DIAGNOSIS — H43813 Vitreous degeneration, bilateral: Secondary | ICD-10-CM | POA: Diagnosis not present

## 2020-05-18 DIAGNOSIS — E113313 Type 2 diabetes mellitus with moderate nonproliferative diabetic retinopathy with macular edema, bilateral: Secondary | ICD-10-CM

## 2020-05-18 DIAGNOSIS — H2513 Age-related nuclear cataract, bilateral: Secondary | ICD-10-CM | POA: Diagnosis not present

## 2020-05-19 ENCOUNTER — Encounter (INDEPENDENT_AMBULATORY_CARE_PROVIDER_SITE_OTHER): Payer: Medicare HMO | Admitting: Ophthalmology

## 2020-05-26 DIAGNOSIS — M5431 Sciatica, right side: Secondary | ICD-10-CM | POA: Diagnosis not present

## 2020-05-26 DIAGNOSIS — K2100 Gastro-esophageal reflux disease with esophagitis, without bleeding: Secondary | ICD-10-CM | POA: Diagnosis not present

## 2020-05-26 DIAGNOSIS — Z0000 Encounter for general adult medical examination without abnormal findings: Secondary | ICD-10-CM | POA: Diagnosis not present

## 2020-05-26 DIAGNOSIS — Z7984 Long term (current) use of oral hypoglycemic drugs: Secondary | ICD-10-CM | POA: Diagnosis not present

## 2020-05-26 DIAGNOSIS — E11319 Type 2 diabetes mellitus with unspecified diabetic retinopathy without macular edema: Secondary | ICD-10-CM | POA: Diagnosis not present

## 2020-05-26 DIAGNOSIS — E785 Hyperlipidemia, unspecified: Secondary | ICD-10-CM | POA: Diagnosis not present

## 2020-05-26 DIAGNOSIS — E1142 Type 2 diabetes mellitus with diabetic polyneuropathy: Secondary | ICD-10-CM | POA: Diagnosis not present

## 2020-06-01 DIAGNOSIS — E890 Postprocedural hypothyroidism: Secondary | ICD-10-CM | POA: Diagnosis not present

## 2020-06-01 DIAGNOSIS — E785 Hyperlipidemia, unspecified: Secondary | ICD-10-CM | POA: Diagnosis not present

## 2020-06-01 DIAGNOSIS — D509 Iron deficiency anemia, unspecified: Secondary | ICD-10-CM | POA: Diagnosis not present

## 2020-06-01 DIAGNOSIS — E11319 Type 2 diabetes mellitus with unspecified diabetic retinopathy without macular edema: Secondary | ICD-10-CM | POA: Diagnosis not present

## 2020-06-01 DIAGNOSIS — E1165 Type 2 diabetes mellitus with hyperglycemia: Secondary | ICD-10-CM | POA: Diagnosis not present

## 2020-06-01 DIAGNOSIS — E1142 Type 2 diabetes mellitus with diabetic polyneuropathy: Secondary | ICD-10-CM | POA: Diagnosis not present

## 2020-06-06 DIAGNOSIS — E119 Type 2 diabetes mellitus without complications: Secondary | ICD-10-CM | POA: Diagnosis not present

## 2020-06-23 ENCOUNTER — Encounter (INDEPENDENT_AMBULATORY_CARE_PROVIDER_SITE_OTHER): Payer: Medicare HMO | Admitting: Ophthalmology

## 2020-06-23 ENCOUNTER — Other Ambulatory Visit: Payer: Self-pay

## 2020-06-23 DIAGNOSIS — E113313 Type 2 diabetes mellitus with moderate nonproliferative diabetic retinopathy with macular edema, bilateral: Secondary | ICD-10-CM | POA: Diagnosis not present

## 2020-06-23 DIAGNOSIS — H43813 Vitreous degeneration, bilateral: Secondary | ICD-10-CM

## 2020-07-10 DIAGNOSIS — E785 Hyperlipidemia, unspecified: Secondary | ICD-10-CM | POA: Diagnosis not present

## 2020-07-10 DIAGNOSIS — E11319 Type 2 diabetes mellitus with unspecified diabetic retinopathy without macular edema: Secondary | ICD-10-CM | POA: Diagnosis not present

## 2020-07-10 DIAGNOSIS — D509 Iron deficiency anemia, unspecified: Secondary | ICD-10-CM | POA: Diagnosis not present

## 2020-07-10 DIAGNOSIS — E890 Postprocedural hypothyroidism: Secondary | ICD-10-CM | POA: Diagnosis not present

## 2020-07-10 DIAGNOSIS — E1165 Type 2 diabetes mellitus with hyperglycemia: Secondary | ICD-10-CM | POA: Diagnosis not present

## 2020-07-10 DIAGNOSIS — E1142 Type 2 diabetes mellitus with diabetic polyneuropathy: Secondary | ICD-10-CM | POA: Diagnosis not present

## 2020-07-18 DIAGNOSIS — E1165 Type 2 diabetes mellitus with hyperglycemia: Secondary | ICD-10-CM | POA: Diagnosis not present

## 2020-07-18 DIAGNOSIS — Z7984 Long term (current) use of oral hypoglycemic drugs: Secondary | ICD-10-CM | POA: Diagnosis not present

## 2020-07-26 DIAGNOSIS — E119 Type 2 diabetes mellitus without complications: Secondary | ICD-10-CM | POA: Diagnosis not present

## 2020-07-28 ENCOUNTER — Other Ambulatory Visit: Payer: Self-pay

## 2020-07-28 ENCOUNTER — Encounter (INDEPENDENT_AMBULATORY_CARE_PROVIDER_SITE_OTHER): Payer: Medicare HMO | Admitting: Ophthalmology

## 2020-07-28 DIAGNOSIS — H2513 Age-related nuclear cataract, bilateral: Secondary | ICD-10-CM | POA: Diagnosis not present

## 2020-07-28 DIAGNOSIS — E113313 Type 2 diabetes mellitus with moderate nonproliferative diabetic retinopathy with macular edema, bilateral: Secondary | ICD-10-CM

## 2020-07-28 DIAGNOSIS — H43813 Vitreous degeneration, bilateral: Secondary | ICD-10-CM

## 2020-08-09 DIAGNOSIS — E1142 Type 2 diabetes mellitus with diabetic polyneuropathy: Secondary | ICD-10-CM | POA: Diagnosis not present

## 2020-08-09 DIAGNOSIS — E1165 Type 2 diabetes mellitus with hyperglycemia: Secondary | ICD-10-CM | POA: Diagnosis not present

## 2020-08-09 DIAGNOSIS — E11319 Type 2 diabetes mellitus with unspecified diabetic retinopathy without macular edema: Secondary | ICD-10-CM | POA: Diagnosis not present

## 2020-08-09 DIAGNOSIS — E785 Hyperlipidemia, unspecified: Secondary | ICD-10-CM | POA: Diagnosis not present

## 2020-08-09 DIAGNOSIS — D509 Iron deficiency anemia, unspecified: Secondary | ICD-10-CM | POA: Diagnosis not present

## 2020-08-25 ENCOUNTER — Other Ambulatory Visit: Payer: Self-pay

## 2020-08-25 ENCOUNTER — Encounter (INDEPENDENT_AMBULATORY_CARE_PROVIDER_SITE_OTHER): Payer: Medicare HMO | Admitting: Ophthalmology

## 2020-08-25 DIAGNOSIS — E113313 Type 2 diabetes mellitus with moderate nonproliferative diabetic retinopathy with macular edema, bilateral: Secondary | ICD-10-CM | POA: Diagnosis not present

## 2020-08-25 DIAGNOSIS — H43813 Vitreous degeneration, bilateral: Secondary | ICD-10-CM | POA: Diagnosis not present

## 2020-09-06 DIAGNOSIS — E1165 Type 2 diabetes mellitus with hyperglycemia: Secondary | ICD-10-CM | POA: Diagnosis not present

## 2020-09-06 DIAGNOSIS — D509 Iron deficiency anemia, unspecified: Secondary | ICD-10-CM | POA: Diagnosis not present

## 2020-09-06 DIAGNOSIS — E1142 Type 2 diabetes mellitus with diabetic polyneuropathy: Secondary | ICD-10-CM | POA: Diagnosis not present

## 2020-09-06 DIAGNOSIS — E890 Postprocedural hypothyroidism: Secondary | ICD-10-CM | POA: Diagnosis not present

## 2020-09-06 DIAGNOSIS — E11319 Type 2 diabetes mellitus with unspecified diabetic retinopathy without macular edema: Secondary | ICD-10-CM | POA: Diagnosis not present

## 2020-09-06 DIAGNOSIS — E785 Hyperlipidemia, unspecified: Secondary | ICD-10-CM | POA: Diagnosis not present

## 2020-09-22 ENCOUNTER — Encounter (INDEPENDENT_AMBULATORY_CARE_PROVIDER_SITE_OTHER): Payer: Medicare HMO | Admitting: Ophthalmology

## 2020-09-22 ENCOUNTER — Other Ambulatory Visit: Payer: Self-pay

## 2020-09-22 DIAGNOSIS — H43813 Vitreous degeneration, bilateral: Secondary | ICD-10-CM

## 2020-09-22 DIAGNOSIS — E113313 Type 2 diabetes mellitus with moderate nonproliferative diabetic retinopathy with macular edema, bilateral: Secondary | ICD-10-CM

## 2020-10-19 ENCOUNTER — Other Ambulatory Visit: Payer: Self-pay

## 2020-10-19 ENCOUNTER — Encounter (INDEPENDENT_AMBULATORY_CARE_PROVIDER_SITE_OTHER): Payer: Medicare HMO | Admitting: Ophthalmology

## 2020-10-19 DIAGNOSIS — E113213 Type 2 diabetes mellitus with mild nonproliferative diabetic retinopathy with macular edema, bilateral: Secondary | ICD-10-CM | POA: Diagnosis not present

## 2020-10-19 DIAGNOSIS — H43813 Vitreous degeneration, bilateral: Secondary | ICD-10-CM

## 2020-10-24 DIAGNOSIS — Z7984 Long term (current) use of oral hypoglycemic drugs: Secondary | ICD-10-CM | POA: Diagnosis not present

## 2020-10-24 DIAGNOSIS — E119 Type 2 diabetes mellitus without complications: Secondary | ICD-10-CM | POA: Diagnosis not present

## 2020-11-06 DIAGNOSIS — E119 Type 2 diabetes mellitus without complications: Secondary | ICD-10-CM | POA: Diagnosis not present

## 2020-11-16 ENCOUNTER — Other Ambulatory Visit: Payer: Self-pay

## 2020-11-16 ENCOUNTER — Encounter (INDEPENDENT_AMBULATORY_CARE_PROVIDER_SITE_OTHER): Payer: Medicare HMO | Admitting: Ophthalmology

## 2020-11-16 DIAGNOSIS — E113313 Type 2 diabetes mellitus with moderate nonproliferative diabetic retinopathy with macular edema, bilateral: Secondary | ICD-10-CM

## 2020-11-16 DIAGNOSIS — H43813 Vitreous degeneration, bilateral: Secondary | ICD-10-CM

## 2020-11-16 DIAGNOSIS — R6889 Other general symptoms and signs: Secondary | ICD-10-CM | POA: Diagnosis not present

## 2020-11-17 ENCOUNTER — Other Ambulatory Visit: Payer: Self-pay | Admitting: Internal Medicine

## 2020-11-17 DIAGNOSIS — Z1231 Encounter for screening mammogram for malignant neoplasm of breast: Secondary | ICD-10-CM

## 2020-11-27 DIAGNOSIS — Z7984 Long term (current) use of oral hypoglycemic drugs: Secondary | ICD-10-CM | POA: Diagnosis not present

## 2020-11-27 DIAGNOSIS — E890 Postprocedural hypothyroidism: Secondary | ICD-10-CM | POA: Diagnosis not present

## 2020-11-27 DIAGNOSIS — I69351 Hemiplegia and hemiparesis following cerebral infarction affecting right dominant side: Secondary | ICD-10-CM | POA: Diagnosis not present

## 2020-11-27 DIAGNOSIS — E785 Hyperlipidemia, unspecified: Secondary | ICD-10-CM | POA: Diagnosis not present

## 2020-11-27 DIAGNOSIS — E1142 Type 2 diabetes mellitus with diabetic polyneuropathy: Secondary | ICD-10-CM | POA: Diagnosis not present

## 2020-11-27 DIAGNOSIS — Z23 Encounter for immunization: Secondary | ICD-10-CM | POA: Diagnosis not present

## 2020-12-14 ENCOUNTER — Other Ambulatory Visit: Payer: Self-pay

## 2020-12-14 ENCOUNTER — Encounter (INDEPENDENT_AMBULATORY_CARE_PROVIDER_SITE_OTHER): Payer: Medicare HMO | Admitting: Ophthalmology

## 2020-12-14 DIAGNOSIS — H43813 Vitreous degeneration, bilateral: Secondary | ICD-10-CM | POA: Diagnosis not present

## 2020-12-14 DIAGNOSIS — E113313 Type 2 diabetes mellitus with moderate nonproliferative diabetic retinopathy with macular edema, bilateral: Secondary | ICD-10-CM | POA: Diagnosis not present

## 2020-12-29 ENCOUNTER — Ambulatory Visit
Admission: RE | Admit: 2020-12-29 | Discharge: 2020-12-29 | Disposition: A | Discharge status: Home / Self Care | Payer: Medicare HMO | Source: Ambulatory Visit | Attending: Internal Medicine | Admitting: Internal Medicine

## 2020-12-29 ENCOUNTER — Other Ambulatory Visit: Payer: Self-pay

## 2020-12-29 DIAGNOSIS — Z1231 Encounter for screening mammogram for malignant neoplasm of breast: Secondary | ICD-10-CM

## 2021-01-04 DIAGNOSIS — E119 Type 2 diabetes mellitus without complications: Secondary | ICD-10-CM | POA: Diagnosis not present

## 2021-01-11 ENCOUNTER — Other Ambulatory Visit: Payer: Self-pay

## 2021-01-11 ENCOUNTER — Encounter (INDEPENDENT_AMBULATORY_CARE_PROVIDER_SITE_OTHER): Payer: Medicare HMO | Admitting: Ophthalmology

## 2021-01-11 DIAGNOSIS — E113313 Type 2 diabetes mellitus with moderate nonproliferative diabetic retinopathy with macular edema, bilateral: Secondary | ICD-10-CM

## 2021-01-11 DIAGNOSIS — H2513 Age-related nuclear cataract, bilateral: Secondary | ICD-10-CM | POA: Diagnosis not present

## 2021-01-11 DIAGNOSIS — H43813 Vitreous degeneration, bilateral: Secondary | ICD-10-CM | POA: Diagnosis not present

## 2021-01-23 DIAGNOSIS — E1142 Type 2 diabetes mellitus with diabetic polyneuropathy: Secondary | ICD-10-CM | POA: Diagnosis not present

## 2021-01-23 DIAGNOSIS — E11319 Type 2 diabetes mellitus with unspecified diabetic retinopathy without macular edema: Secondary | ICD-10-CM | POA: Diagnosis not present

## 2021-01-23 DIAGNOSIS — D509 Iron deficiency anemia, unspecified: Secondary | ICD-10-CM | POA: Diagnosis not present

## 2021-01-23 DIAGNOSIS — E890 Postprocedural hypothyroidism: Secondary | ICD-10-CM | POA: Diagnosis not present

## 2021-01-23 DIAGNOSIS — E1165 Type 2 diabetes mellitus with hyperglycemia: Secondary | ICD-10-CM | POA: Diagnosis not present

## 2021-01-23 DIAGNOSIS — E785 Hyperlipidemia, unspecified: Secondary | ICD-10-CM | POA: Diagnosis not present

## 2021-02-08 ENCOUNTER — Encounter (INDEPENDENT_AMBULATORY_CARE_PROVIDER_SITE_OTHER): Payer: Medicare HMO | Admitting: Ophthalmology

## 2021-02-12 ENCOUNTER — Encounter (INDEPENDENT_AMBULATORY_CARE_PROVIDER_SITE_OTHER): Payer: Medicare HMO | Admitting: Ophthalmology

## 2021-02-12 ENCOUNTER — Other Ambulatory Visit: Payer: Self-pay

## 2021-02-12 DIAGNOSIS — E113313 Type 2 diabetes mellitus with moderate nonproliferative diabetic retinopathy with macular edema, bilateral: Secondary | ICD-10-CM

## 2021-02-12 DIAGNOSIS — H43813 Vitreous degeneration, bilateral: Secondary | ICD-10-CM | POA: Diagnosis not present

## 2021-02-27 DIAGNOSIS — E1165 Type 2 diabetes mellitus with hyperglycemia: Secondary | ICD-10-CM | POA: Diagnosis not present

## 2021-02-27 DIAGNOSIS — E11319 Type 2 diabetes mellitus with unspecified diabetic retinopathy without macular edema: Secondary | ICD-10-CM | POA: Diagnosis not present

## 2021-02-27 DIAGNOSIS — Z7984 Long term (current) use of oral hypoglycemic drugs: Secondary | ICD-10-CM | POA: Diagnosis not present

## 2021-03-08 DIAGNOSIS — E119 Type 2 diabetes mellitus without complications: Secondary | ICD-10-CM | POA: Diagnosis not present

## 2021-03-12 ENCOUNTER — Other Ambulatory Visit: Payer: Self-pay

## 2021-03-12 ENCOUNTER — Encounter (INDEPENDENT_AMBULATORY_CARE_PROVIDER_SITE_OTHER): Payer: Medicare HMO | Admitting: Ophthalmology

## 2021-03-12 DIAGNOSIS — H43813 Vitreous degeneration, bilateral: Secondary | ICD-10-CM | POA: Diagnosis not present

## 2021-03-12 DIAGNOSIS — H2513 Age-related nuclear cataract, bilateral: Secondary | ICD-10-CM | POA: Diagnosis not present

## 2021-03-12 DIAGNOSIS — E113313 Type 2 diabetes mellitus with moderate nonproliferative diabetic retinopathy with macular edema, bilateral: Secondary | ICD-10-CM | POA: Diagnosis not present

## 2021-03-19 DIAGNOSIS — E11319 Type 2 diabetes mellitus with unspecified diabetic retinopathy without macular edema: Secondary | ICD-10-CM | POA: Diagnosis not present

## 2021-03-19 DIAGNOSIS — D509 Iron deficiency anemia, unspecified: Secondary | ICD-10-CM | POA: Diagnosis not present

## 2021-03-19 DIAGNOSIS — E785 Hyperlipidemia, unspecified: Secondary | ICD-10-CM | POA: Diagnosis not present

## 2021-03-19 DIAGNOSIS — E890 Postprocedural hypothyroidism: Secondary | ICD-10-CM | POA: Diagnosis not present

## 2021-03-19 DIAGNOSIS — E1165 Type 2 diabetes mellitus with hyperglycemia: Secondary | ICD-10-CM | POA: Diagnosis not present

## 2021-03-19 DIAGNOSIS — E1142 Type 2 diabetes mellitus with diabetic polyneuropathy: Secondary | ICD-10-CM | POA: Diagnosis not present

## 2021-04-09 ENCOUNTER — Other Ambulatory Visit: Payer: Self-pay

## 2021-04-09 ENCOUNTER — Encounter (INDEPENDENT_AMBULATORY_CARE_PROVIDER_SITE_OTHER): Payer: Medicare HMO | Admitting: Ophthalmology

## 2021-04-09 DIAGNOSIS — E113313 Type 2 diabetes mellitus with moderate nonproliferative diabetic retinopathy with macular edema, bilateral: Secondary | ICD-10-CM | POA: Diagnosis not present

## 2021-04-09 DIAGNOSIS — H43813 Vitreous degeneration, bilateral: Secondary | ICD-10-CM

## 2021-04-09 DIAGNOSIS — H2513 Age-related nuclear cataract, bilateral: Secondary | ICD-10-CM | POA: Diagnosis not present

## 2021-05-07 ENCOUNTER — Other Ambulatory Visit: Payer: Self-pay

## 2021-05-07 ENCOUNTER — Encounter (INDEPENDENT_AMBULATORY_CARE_PROVIDER_SITE_OTHER): Payer: Medicare HMO | Admitting: Ophthalmology

## 2021-05-07 DIAGNOSIS — H2513 Age-related nuclear cataract, bilateral: Secondary | ICD-10-CM | POA: Diagnosis not present

## 2021-05-07 DIAGNOSIS — H43813 Vitreous degeneration, bilateral: Secondary | ICD-10-CM

## 2021-05-07 DIAGNOSIS — E113313 Type 2 diabetes mellitus with moderate nonproliferative diabetic retinopathy with macular edema, bilateral: Secondary | ICD-10-CM

## 2021-05-29 DIAGNOSIS — Z7984 Long term (current) use of oral hypoglycemic drugs: Secondary | ICD-10-CM | POA: Diagnosis not present

## 2021-05-29 DIAGNOSIS — E11319 Type 2 diabetes mellitus with unspecified diabetic retinopathy without macular edema: Secondary | ICD-10-CM | POA: Diagnosis not present

## 2021-05-29 DIAGNOSIS — E1165 Type 2 diabetes mellitus with hyperglycemia: Secondary | ICD-10-CM | POA: Diagnosis not present

## 2021-05-31 DIAGNOSIS — E2839 Other primary ovarian failure: Secondary | ICD-10-CM | POA: Diagnosis not present

## 2021-05-31 DIAGNOSIS — Z0000 Encounter for general adult medical examination without abnormal findings: Secondary | ICD-10-CM | POA: Diagnosis not present

## 2021-05-31 DIAGNOSIS — E890 Postprocedural hypothyroidism: Secondary | ICD-10-CM | POA: Diagnosis not present

## 2021-05-31 DIAGNOSIS — Z1159 Encounter for screening for other viral diseases: Secondary | ICD-10-CM | POA: Diagnosis not present

## 2021-05-31 DIAGNOSIS — E11319 Type 2 diabetes mellitus with unspecified diabetic retinopathy without macular edema: Secondary | ICD-10-CM | POA: Diagnosis not present

## 2021-05-31 DIAGNOSIS — Z1389 Encounter for screening for other disorder: Secondary | ICD-10-CM | POA: Diagnosis not present

## 2021-05-31 DIAGNOSIS — E559 Vitamin D deficiency, unspecified: Secondary | ICD-10-CM | POA: Diagnosis not present

## 2021-05-31 DIAGNOSIS — E785 Hyperlipidemia, unspecified: Secondary | ICD-10-CM | POA: Diagnosis not present

## 2021-05-31 DIAGNOSIS — E1142 Type 2 diabetes mellitus with diabetic polyneuropathy: Secondary | ICD-10-CM | POA: Diagnosis not present

## 2021-05-31 DIAGNOSIS — I69351 Hemiplegia and hemiparesis following cerebral infarction affecting right dominant side: Secondary | ICD-10-CM | POA: Diagnosis not present

## 2021-05-31 DIAGNOSIS — Z23 Encounter for immunization: Secondary | ICD-10-CM | POA: Diagnosis not present

## 2021-05-31 DIAGNOSIS — Z1231 Encounter for screening mammogram for malignant neoplasm of breast: Secondary | ICD-10-CM | POA: Diagnosis not present

## 2021-06-07 DIAGNOSIS — E785 Hyperlipidemia, unspecified: Secondary | ICD-10-CM | POA: Diagnosis not present

## 2021-06-07 DIAGNOSIS — E1142 Type 2 diabetes mellitus with diabetic polyneuropathy: Secondary | ICD-10-CM | POA: Diagnosis not present

## 2021-06-07 DIAGNOSIS — E119 Type 2 diabetes mellitus without complications: Secondary | ICD-10-CM | POA: Diagnosis not present

## 2021-06-07 DIAGNOSIS — Z8673 Personal history of transient ischemic attack (TIA), and cerebral infarction without residual deficits: Secondary | ICD-10-CM | POA: Diagnosis not present

## 2021-06-11 ENCOUNTER — Encounter (INDEPENDENT_AMBULATORY_CARE_PROVIDER_SITE_OTHER): Payer: Medicare HMO | Admitting: Ophthalmology

## 2021-06-11 DIAGNOSIS — H43813 Vitreous degeneration, bilateral: Secondary | ICD-10-CM | POA: Diagnosis not present

## 2021-06-11 DIAGNOSIS — E113313 Type 2 diabetes mellitus with moderate nonproliferative diabetic retinopathy with macular edema, bilateral: Secondary | ICD-10-CM

## 2021-06-18 DIAGNOSIS — H52213 Irregular astigmatism, bilateral: Secondary | ICD-10-CM | POA: Diagnosis not present

## 2021-06-18 DIAGNOSIS — H2512 Age-related nuclear cataract, left eye: Secondary | ICD-10-CM | POA: Diagnosis not present

## 2021-06-18 DIAGNOSIS — H524 Presbyopia: Secondary | ICD-10-CM | POA: Diagnosis not present

## 2021-06-18 DIAGNOSIS — H1820 Unspecified corneal edema: Secondary | ICD-10-CM | POA: Diagnosis not present

## 2021-06-18 DIAGNOSIS — H2513 Age-related nuclear cataract, bilateral: Secondary | ICD-10-CM | POA: Diagnosis not present

## 2021-06-18 DIAGNOSIS — H25013 Cortical age-related cataract, bilateral: Secondary | ICD-10-CM | POA: Diagnosis not present

## 2021-06-18 DIAGNOSIS — E11311 Type 2 diabetes mellitus with unspecified diabetic retinopathy with macular edema: Secondary | ICD-10-CM | POA: Diagnosis not present

## 2021-07-09 ENCOUNTER — Encounter (INDEPENDENT_AMBULATORY_CARE_PROVIDER_SITE_OTHER): Payer: Medicare HMO | Admitting: Ophthalmology

## 2021-07-09 DIAGNOSIS — E113313 Type 2 diabetes mellitus with moderate nonproliferative diabetic retinopathy with macular edema, bilateral: Secondary | ICD-10-CM | POA: Diagnosis not present

## 2021-07-09 DIAGNOSIS — H43813 Vitreous degeneration, bilateral: Secondary | ICD-10-CM

## 2021-07-09 DIAGNOSIS — H2513 Age-related nuclear cataract, bilateral: Secondary | ICD-10-CM

## 2021-08-07 DIAGNOSIS — H25812 Combined forms of age-related cataract, left eye: Secondary | ICD-10-CM | POA: Diagnosis not present

## 2021-08-07 DIAGNOSIS — H2512 Age-related nuclear cataract, left eye: Secondary | ICD-10-CM | POA: Diagnosis not present

## 2021-08-13 ENCOUNTER — Encounter (INDEPENDENT_AMBULATORY_CARE_PROVIDER_SITE_OTHER): Payer: Medicare HMO | Admitting: Ophthalmology

## 2021-08-13 DIAGNOSIS — E113313 Type 2 diabetes mellitus with moderate nonproliferative diabetic retinopathy with macular edema, bilateral: Secondary | ICD-10-CM

## 2021-08-13 DIAGNOSIS — H43813 Vitreous degeneration, bilateral: Secondary | ICD-10-CM

## 2021-08-29 DIAGNOSIS — H2511 Age-related nuclear cataract, right eye: Secondary | ICD-10-CM | POA: Diagnosis not present

## 2021-08-29 DIAGNOSIS — H25011 Cortical age-related cataract, right eye: Secondary | ICD-10-CM | POA: Diagnosis not present

## 2021-09-10 ENCOUNTER — Encounter (INDEPENDENT_AMBULATORY_CARE_PROVIDER_SITE_OTHER): Payer: Medicare HMO | Admitting: Ophthalmology

## 2021-09-10 DIAGNOSIS — E113313 Type 2 diabetes mellitus with moderate nonproliferative diabetic retinopathy with macular edema, bilateral: Secondary | ICD-10-CM | POA: Diagnosis not present

## 2021-09-10 DIAGNOSIS — H43813 Vitreous degeneration, bilateral: Secondary | ICD-10-CM | POA: Diagnosis not present

## 2021-09-17 DIAGNOSIS — E1165 Type 2 diabetes mellitus with hyperglycemia: Secondary | ICD-10-CM | POA: Diagnosis not present

## 2021-09-17 DIAGNOSIS — E1159 Type 2 diabetes mellitus with other circulatory complications: Secondary | ICD-10-CM | POA: Diagnosis not present

## 2021-09-17 DIAGNOSIS — Z7984 Long term (current) use of oral hypoglycemic drugs: Secondary | ICD-10-CM | POA: Diagnosis not present

## 2021-09-17 DIAGNOSIS — E11319 Type 2 diabetes mellitus with unspecified diabetic retinopathy without macular edema: Secondary | ICD-10-CM | POA: Diagnosis not present

## 2021-09-17 DIAGNOSIS — Z8673 Personal history of transient ischemic attack (TIA), and cerebral infarction without residual deficits: Secondary | ICD-10-CM | POA: Diagnosis not present

## 2021-09-18 DIAGNOSIS — D509 Iron deficiency anemia, unspecified: Secondary | ICD-10-CM | POA: Diagnosis not present

## 2021-09-18 DIAGNOSIS — E785 Hyperlipidemia, unspecified: Secondary | ICD-10-CM | POA: Diagnosis not present

## 2021-09-18 DIAGNOSIS — E1142 Type 2 diabetes mellitus with diabetic polyneuropathy: Secondary | ICD-10-CM | POA: Diagnosis not present

## 2021-09-19 DIAGNOSIS — Z7901 Long term (current) use of anticoagulants: Secondary | ICD-10-CM | POA: Diagnosis not present

## 2021-09-19 DIAGNOSIS — Z1211 Encounter for screening for malignant neoplasm of colon: Secondary | ICD-10-CM | POA: Diagnosis not present

## 2021-09-20 DIAGNOSIS — E119 Type 2 diabetes mellitus without complications: Secondary | ICD-10-CM | POA: Diagnosis not present

## 2021-10-08 ENCOUNTER — Encounter (INDEPENDENT_AMBULATORY_CARE_PROVIDER_SITE_OTHER): Payer: Medicare HMO | Admitting: Ophthalmology

## 2021-10-08 DIAGNOSIS — H43813 Vitreous degeneration, bilateral: Secondary | ICD-10-CM | POA: Diagnosis not present

## 2021-10-08 DIAGNOSIS — I10 Essential (primary) hypertension: Secondary | ICD-10-CM | POA: Diagnosis not present

## 2021-10-08 DIAGNOSIS — H35033 Hypertensive retinopathy, bilateral: Secondary | ICD-10-CM | POA: Diagnosis not present

## 2021-10-08 DIAGNOSIS — E113313 Type 2 diabetes mellitus with moderate nonproliferative diabetic retinopathy with macular edema, bilateral: Secondary | ICD-10-CM

## 2021-10-14 IMAGING — MR MR ANKLE*R* W/O CM
5 series · 36 of 40 positions shown · non-contrast
Comparison: None.

CLINICAL DATA: Lateral right ankle pain since the patient stop
listed her ankle in June 2019.

EXAM:
MRI OF THE RIGHT ANKLE WITHOUT CONTRAST
TECHNIQUE: Multiplanar, multisequence MR imaging of the ankle was performed. No
intravenous contrast was administered.

[Series 4: T2 fat-sat · axial · 3.0mm · 0.50mm/px · z∈[-84,+37]mm · 9 of 32 slices shown (1 of 2)]
[im 1/32]
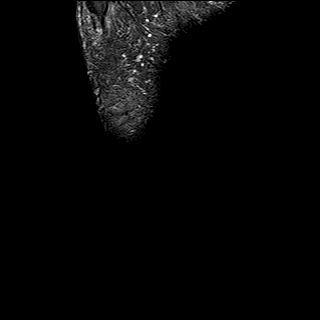
[im 4/32]
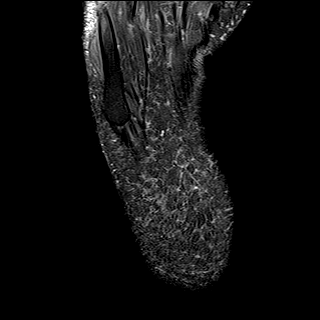
[im 8/32]
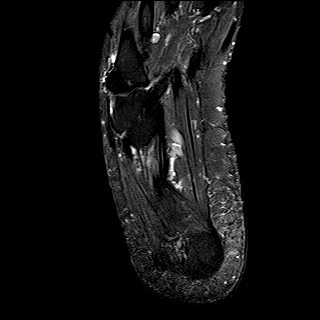
[im 12/32]
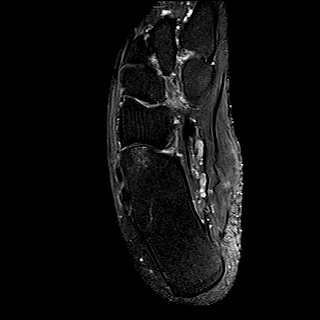
[im 16/32]
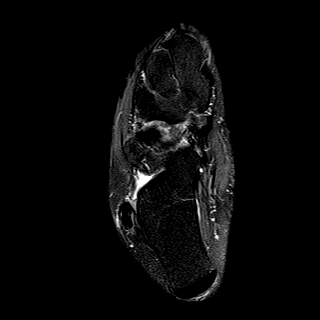
[im 20/32]
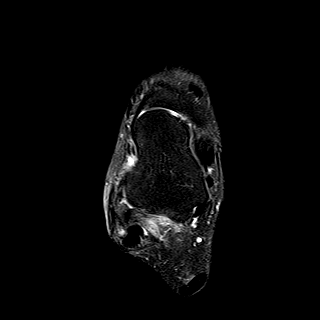
[im 24/32]
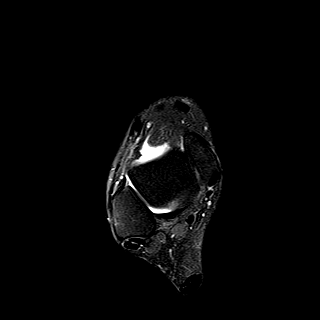
[im 28/32]
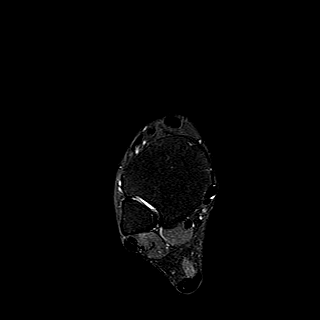
[im 32/32]
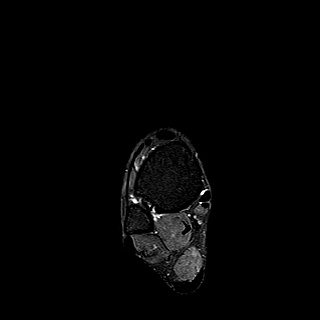

[Series 5: PD fat-sat · axial · 3.0mm · 0.42mm/px · z∈[-84,+37]mm · 9 of 32 slices shown]
[im 1/32]
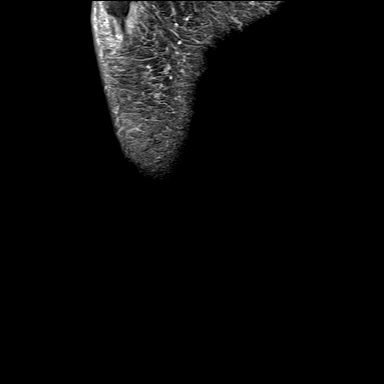
[im 4/32]
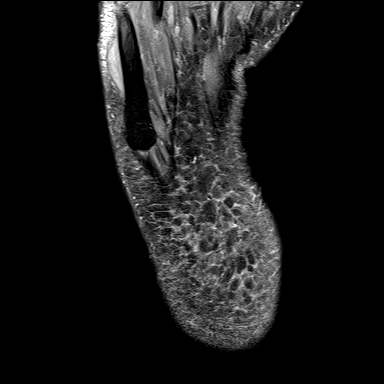
[im 8/32]
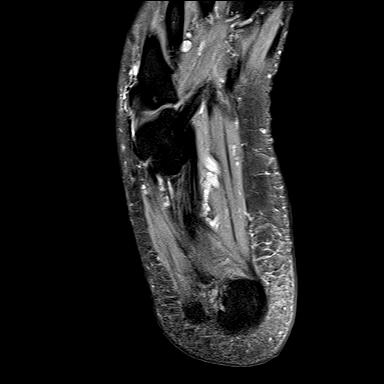
[im 12/32]
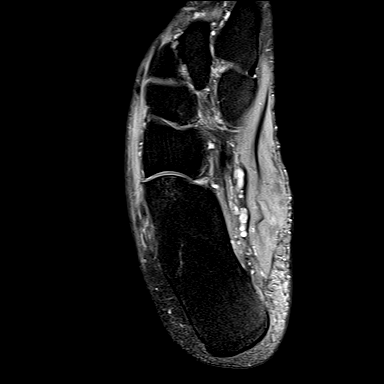
[im 16/32]
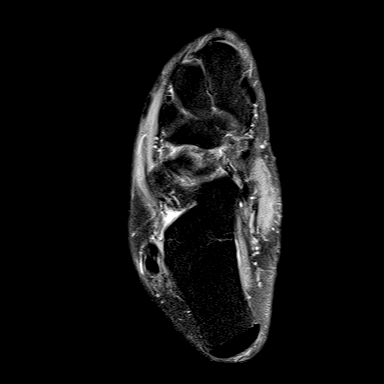
[im 20/32]
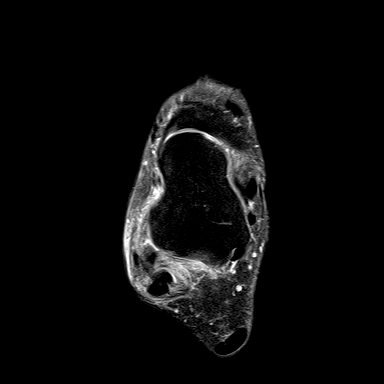
[im 24/32]
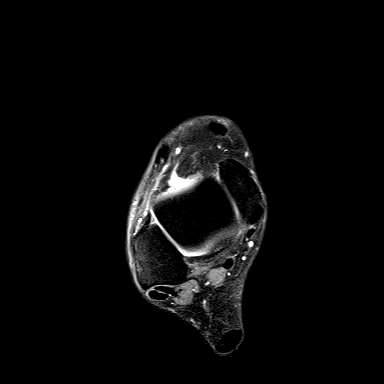
[im 28/32]
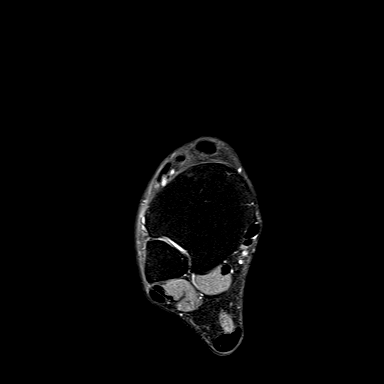
[im 32/32]
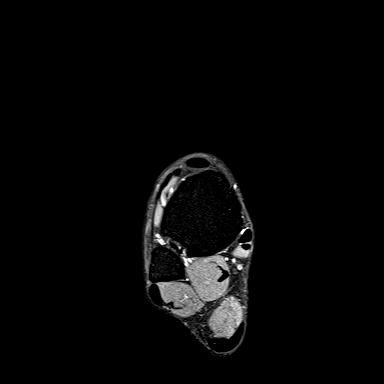

[Series 6: T1 · sagittal · 4.0mm · 0.56mm/px · 6 of 20 slices shown]
[im 1/20]
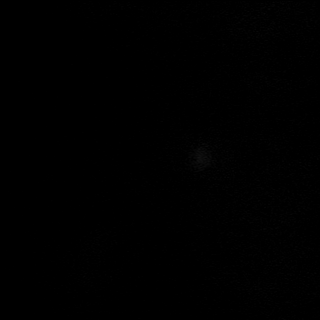
[im 4/20]
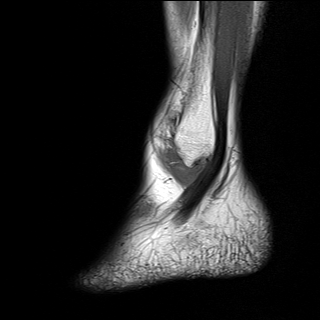
[im 8/20]
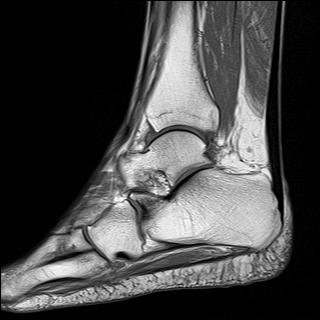
[im 12/20]
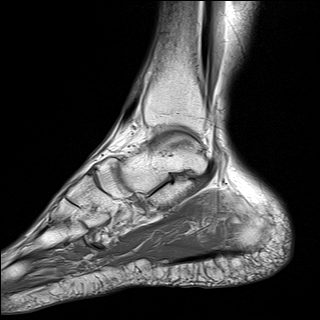
[im 16/20]
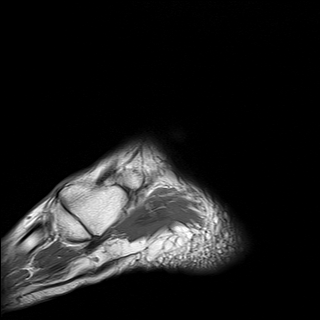
[im 20/20]
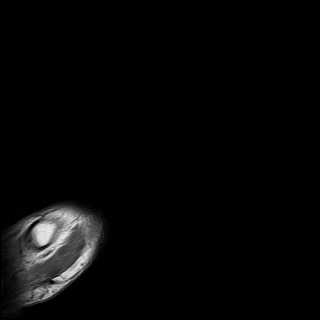

[Series 7: STIR · sagittal · 4.0mm · 0.35mm/px · 4 of 20 slices shown]
[im 1/20]
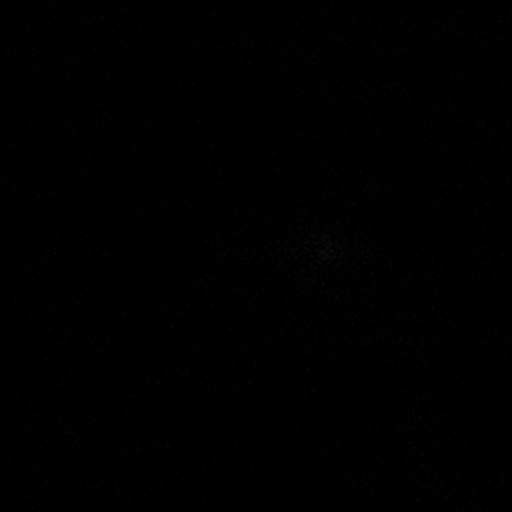
[im 4/20]
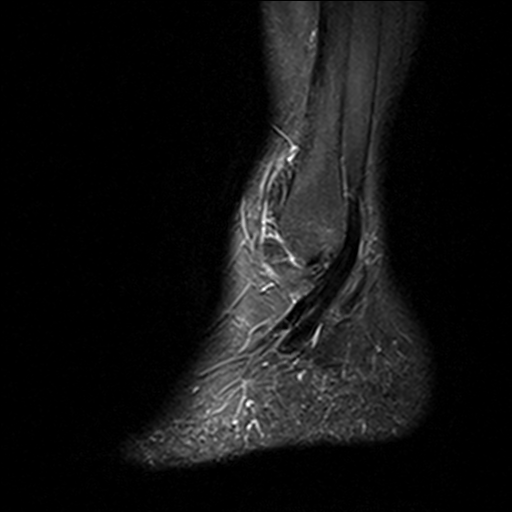
[im 8/20]
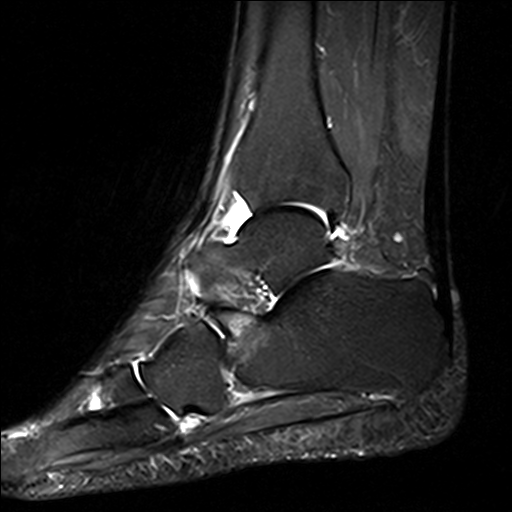
[im 12/20]
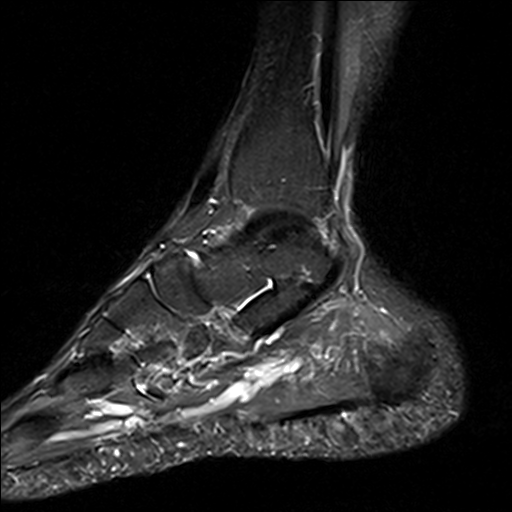

[Series 8: T2 fat-sat · coronal · 3.0mm · 0.50mm/px · 8 of 35 slices shown (2 of 2)]
[im 1/35]
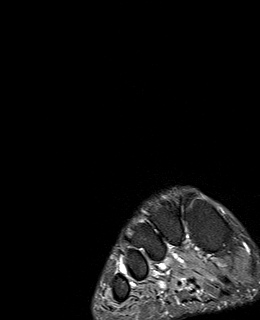
[im 4/35]
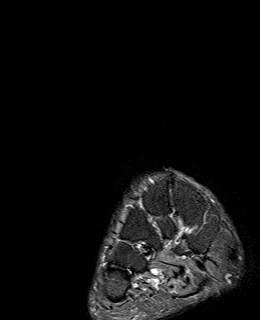
[im 12/35]
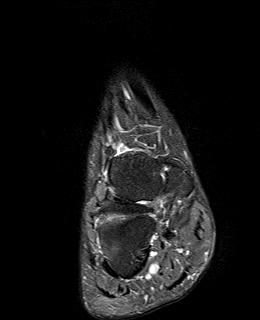
[im 16/35]
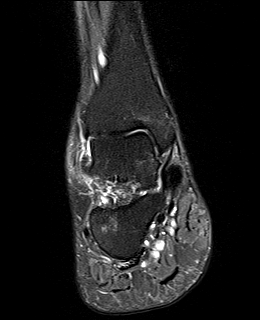
[im 19/35]
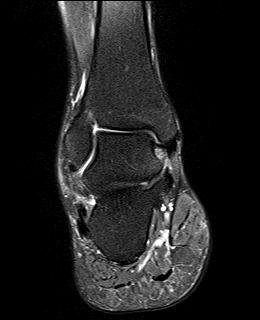
[im 23/35]
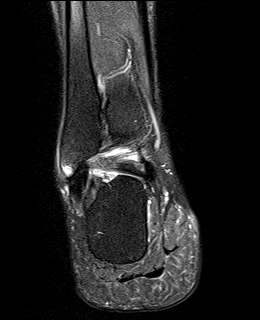
[im 31/35]
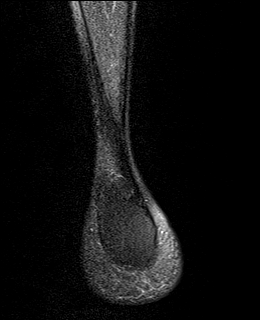
[im 35/35]
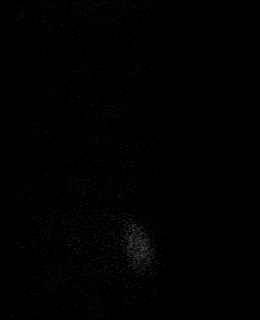

[36 of 40 positions shown; findings below may reference images not displayed]

FINDINGS: TENDONS

Peroneal: Intact.

Posteromedial: Intact.

Anterior: Intact.

Achilles: Intact.

Plantar Fascia: Normal.

LIGAMENTS

Lateral: The anterior talofibular and calcaneofibular ligaments are
thickened with intermediate increased T2 signal but intact fibers
are identified. Lateral ligaments otherwise negative.

Medial: Intact.

CARTILAGE

Ankle Joint: No osteochondral lesion of the talar dome. Small joint
effusion noted.

Subtalar Joints/Sinus Tarsi: Negative.

Bones: Possible chip fracture off the talus on plain films is not
visible on this study. There is subchondral edema in the calcaneus
at the calcaneocuboid joint with thickening and mild concavity of
subchondral bone at the superior aspect of the joint. Marrow signal
in the cuboid is normal. There is mild edema in the distal fibula
and a tiny cyst in the posterior aspect of the lateral malleolus.

Other: None.
IMPRESSION: Both the calcaneofibular and anterior talofibular ligaments are
thickened with intermediate increased T2 signal consistent with
sprain but the ligaments appear intact.

Edema and mild concavity of the calcaneus at the calcaneocuboid
joint is likely due to fracture of the subchondral bone plate.
Osteoarthritis is less likely given absence of signal change in the
cuboid.

Mild edema and a tiny cyst in the lateral malleolus may be due to
contusion and ligament sprain.

## 2021-10-23 DIAGNOSIS — H2511 Age-related nuclear cataract, right eye: Secondary | ICD-10-CM | POA: Diagnosis not present

## 2021-10-23 DIAGNOSIS — H25811 Combined forms of age-related cataract, right eye: Secondary | ICD-10-CM | POA: Diagnosis not present

## 2021-10-23 DIAGNOSIS — H25011 Cortical age-related cataract, right eye: Secondary | ICD-10-CM | POA: Diagnosis not present

## 2021-11-05 ENCOUNTER — Encounter (INDEPENDENT_AMBULATORY_CARE_PROVIDER_SITE_OTHER): Payer: Medicare HMO | Admitting: Ophthalmology

## 2021-11-05 DIAGNOSIS — E113313 Type 2 diabetes mellitus with moderate nonproliferative diabetic retinopathy with macular edema, bilateral: Secondary | ICD-10-CM

## 2021-11-05 DIAGNOSIS — H43813 Vitreous degeneration, bilateral: Secondary | ICD-10-CM | POA: Diagnosis not present

## 2021-11-22 DIAGNOSIS — E1142 Type 2 diabetes mellitus with diabetic polyneuropathy: Secondary | ICD-10-CM | POA: Diagnosis not present

## 2021-11-22 DIAGNOSIS — E785 Hyperlipidemia, unspecified: Secondary | ICD-10-CM | POA: Diagnosis not present

## 2021-11-22 DIAGNOSIS — D509 Iron deficiency anemia, unspecified: Secondary | ICD-10-CM | POA: Diagnosis not present

## 2021-11-28 DIAGNOSIS — K648 Other hemorrhoids: Secondary | ICD-10-CM | POA: Diagnosis not present

## 2021-11-28 DIAGNOSIS — K644 Residual hemorrhoidal skin tags: Secondary | ICD-10-CM | POA: Diagnosis not present

## 2021-11-28 DIAGNOSIS — D124 Benign neoplasm of descending colon: Secondary | ICD-10-CM | POA: Diagnosis not present

## 2021-11-28 DIAGNOSIS — Z1211 Encounter for screening for malignant neoplasm of colon: Secondary | ICD-10-CM | POA: Diagnosis not present

## 2021-11-29 DIAGNOSIS — E785 Hyperlipidemia, unspecified: Secondary | ICD-10-CM | POA: Diagnosis not present

## 2021-11-29 DIAGNOSIS — Z23 Encounter for immunization: Secondary | ICD-10-CM | POA: Diagnosis not present

## 2021-11-29 DIAGNOSIS — E1142 Type 2 diabetes mellitus with diabetic polyneuropathy: Secondary | ICD-10-CM | POA: Diagnosis not present

## 2021-11-29 DIAGNOSIS — K635 Polyp of colon: Secondary | ICD-10-CM | POA: Diagnosis not present

## 2021-11-29 DIAGNOSIS — E890 Postprocedural hypothyroidism: Secondary | ICD-10-CM | POA: Diagnosis not present

## 2021-11-29 DIAGNOSIS — I69351 Hemiplegia and hemiparesis following cerebral infarction affecting right dominant side: Secondary | ICD-10-CM | POA: Diagnosis not present

## 2021-11-30 ENCOUNTER — Other Ambulatory Visit: Payer: Self-pay | Admitting: Internal Medicine

## 2021-11-30 DIAGNOSIS — Z1231 Encounter for screening mammogram for malignant neoplasm of breast: Secondary | ICD-10-CM

## 2021-11-30 DIAGNOSIS — D124 Benign neoplasm of descending colon: Secondary | ICD-10-CM | POA: Diagnosis not present

## 2021-12-03 ENCOUNTER — Encounter (INDEPENDENT_AMBULATORY_CARE_PROVIDER_SITE_OTHER): Payer: Medicare HMO | Admitting: Ophthalmology

## 2021-12-03 DIAGNOSIS — E113313 Type 2 diabetes mellitus with moderate nonproliferative diabetic retinopathy with macular edema, bilateral: Secondary | ICD-10-CM | POA: Diagnosis not present

## 2021-12-03 DIAGNOSIS — H43813 Vitreous degeneration, bilateral: Secondary | ICD-10-CM

## 2021-12-31 ENCOUNTER — Encounter (INDEPENDENT_AMBULATORY_CARE_PROVIDER_SITE_OTHER): Payer: Medicare HMO | Admitting: Ophthalmology

## 2021-12-31 DIAGNOSIS — E113313 Type 2 diabetes mellitus with moderate nonproliferative diabetic retinopathy with macular edema, bilateral: Secondary | ICD-10-CM

## 2021-12-31 DIAGNOSIS — H43813 Vitreous degeneration, bilateral: Secondary | ICD-10-CM

## 2022-01-01 ENCOUNTER — Encounter (INDEPENDENT_AMBULATORY_CARE_PROVIDER_SITE_OTHER): Payer: Medicare HMO | Admitting: Ophthalmology

## 2022-01-03 ENCOUNTER — Ambulatory Visit
Admission: RE | Admit: 2022-01-03 | Discharge: 2022-01-03 | Disposition: A | Discharge status: Home / Self Care | Payer: Medicare HMO | Source: Ambulatory Visit | Attending: Internal Medicine | Admitting: Internal Medicine

## 2022-01-03 DIAGNOSIS — Z1231 Encounter for screening mammogram for malignant neoplasm of breast: Secondary | ICD-10-CM | POA: Diagnosis not present

## 2022-01-22 DIAGNOSIS — Z8673 Personal history of transient ischemic attack (TIA), and cerebral infarction without residual deficits: Secondary | ICD-10-CM | POA: Diagnosis not present

## 2022-01-22 DIAGNOSIS — Z7984 Long term (current) use of oral hypoglycemic drugs: Secondary | ICD-10-CM | POA: Diagnosis not present

## 2022-01-22 DIAGNOSIS — E1165 Type 2 diabetes mellitus with hyperglycemia: Secondary | ICD-10-CM | POA: Diagnosis not present

## 2022-01-22 DIAGNOSIS — E11319 Type 2 diabetes mellitus with unspecified diabetic retinopathy without macular edema: Secondary | ICD-10-CM | POA: Diagnosis not present

## 2022-01-28 DIAGNOSIS — E119 Type 2 diabetes mellitus without complications: Secondary | ICD-10-CM | POA: Diagnosis not present

## 2022-02-01 ENCOUNTER — Encounter (INDEPENDENT_AMBULATORY_CARE_PROVIDER_SITE_OTHER): Payer: Medicare HMO | Admitting: Ophthalmology

## 2022-02-01 DIAGNOSIS — H43813 Vitreous degeneration, bilateral: Secondary | ICD-10-CM | POA: Diagnosis not present

## 2022-02-01 DIAGNOSIS — E113313 Type 2 diabetes mellitus with moderate nonproliferative diabetic retinopathy with macular edema, bilateral: Secondary | ICD-10-CM

## 2022-03-01 ENCOUNTER — Encounter (INDEPENDENT_AMBULATORY_CARE_PROVIDER_SITE_OTHER): Payer: Medicare HMO | Admitting: Ophthalmology

## 2022-03-01 DIAGNOSIS — E113313 Type 2 diabetes mellitus with moderate nonproliferative diabetic retinopathy with macular edema, bilateral: Secondary | ICD-10-CM | POA: Diagnosis not present

## 2022-03-01 DIAGNOSIS — H43813 Vitreous degeneration, bilateral: Secondary | ICD-10-CM

## 2022-03-29 ENCOUNTER — Encounter (INDEPENDENT_AMBULATORY_CARE_PROVIDER_SITE_OTHER): Payer: Medicare HMO | Admitting: Ophthalmology

## 2022-03-29 DIAGNOSIS — H43813 Vitreous degeneration, bilateral: Secondary | ICD-10-CM | POA: Diagnosis not present

## 2022-03-29 DIAGNOSIS — E113313 Type 2 diabetes mellitus with moderate nonproliferative diabetic retinopathy with macular edema, bilateral: Secondary | ICD-10-CM | POA: Diagnosis not present

## 2022-04-26 ENCOUNTER — Encounter (INDEPENDENT_AMBULATORY_CARE_PROVIDER_SITE_OTHER): Payer: Medicare HMO | Admitting: Ophthalmology

## 2022-04-29 ENCOUNTER — Encounter (INDEPENDENT_AMBULATORY_CARE_PROVIDER_SITE_OTHER): Payer: Medicare HMO | Admitting: Ophthalmology

## 2022-04-29 DIAGNOSIS — H43813 Vitreous degeneration, bilateral: Secondary | ICD-10-CM

## 2022-04-29 DIAGNOSIS — E113313 Type 2 diabetes mellitus with moderate nonproliferative diabetic retinopathy with macular edema, bilateral: Secondary | ICD-10-CM | POA: Diagnosis not present

## 2022-05-01 DIAGNOSIS — E1142 Type 2 diabetes mellitus with diabetic polyneuropathy: Secondary | ICD-10-CM | POA: Diagnosis not present

## 2022-05-01 DIAGNOSIS — E785 Hyperlipidemia, unspecified: Secondary | ICD-10-CM | POA: Diagnosis not present

## 2022-05-01 DIAGNOSIS — D509 Iron deficiency anemia, unspecified: Secondary | ICD-10-CM | POA: Diagnosis not present

## 2022-05-27 ENCOUNTER — Encounter (INDEPENDENT_AMBULATORY_CARE_PROVIDER_SITE_OTHER): Payer: Medicare HMO | Admitting: Ophthalmology

## 2022-05-27 DIAGNOSIS — H43813 Vitreous degeneration, bilateral: Secondary | ICD-10-CM

## 2022-05-27 DIAGNOSIS — E113313 Type 2 diabetes mellitus with moderate nonproliferative diabetic retinopathy with macular edema, bilateral: Secondary | ICD-10-CM

## 2022-05-28 DIAGNOSIS — Z7984 Long term (current) use of oral hypoglycemic drugs: Secondary | ICD-10-CM | POA: Diagnosis not present

## 2022-05-28 DIAGNOSIS — E11319 Type 2 diabetes mellitus with unspecified diabetic retinopathy without macular edema: Secondary | ICD-10-CM | POA: Diagnosis not present

## 2022-05-28 DIAGNOSIS — E1165 Type 2 diabetes mellitus with hyperglycemia: Secondary | ICD-10-CM | POA: Diagnosis not present

## 2022-05-28 DIAGNOSIS — Z8673 Personal history of transient ischemic attack (TIA), and cerebral infarction without residual deficits: Secondary | ICD-10-CM | POA: Diagnosis not present

## 2022-06-04 DIAGNOSIS — E119 Type 2 diabetes mellitus without complications: Secondary | ICD-10-CM | POA: Diagnosis not present

## 2022-06-14 ENCOUNTER — Other Ambulatory Visit: Payer: Self-pay | Admitting: Internal Medicine

## 2022-06-14 DIAGNOSIS — E11319 Type 2 diabetes mellitus with unspecified diabetic retinopathy without macular edema: Secondary | ICD-10-CM | POA: Diagnosis not present

## 2022-06-14 DIAGNOSIS — Z0000 Encounter for general adult medical examination without abnormal findings: Secondary | ICD-10-CM | POA: Diagnosis not present

## 2022-06-14 DIAGNOSIS — Z1389 Encounter for screening for other disorder: Secondary | ICD-10-CM | POA: Diagnosis not present

## 2022-06-14 DIAGNOSIS — E785 Hyperlipidemia, unspecified: Secondary | ICD-10-CM | POA: Diagnosis not present

## 2022-06-14 DIAGNOSIS — E2839 Other primary ovarian failure: Secondary | ICD-10-CM

## 2022-06-14 DIAGNOSIS — E1142 Type 2 diabetes mellitus with diabetic polyneuropathy: Secondary | ICD-10-CM | POA: Diagnosis not present

## 2022-06-14 DIAGNOSIS — D509 Iron deficiency anemia, unspecified: Secondary | ICD-10-CM | POA: Diagnosis not present

## 2022-06-14 DIAGNOSIS — E1165 Type 2 diabetes mellitus with hyperglycemia: Secondary | ICD-10-CM | POA: Diagnosis not present

## 2022-06-20 ENCOUNTER — Other Ambulatory Visit: Payer: Self-pay | Admitting: Internal Medicine

## 2022-06-20 DIAGNOSIS — Z1231 Encounter for screening mammogram for malignant neoplasm of breast: Secondary | ICD-10-CM

## 2022-06-24 ENCOUNTER — Encounter (INDEPENDENT_AMBULATORY_CARE_PROVIDER_SITE_OTHER): Payer: Medicare HMO | Admitting: Ophthalmology

## 2022-06-24 DIAGNOSIS — E113313 Type 2 diabetes mellitus with moderate nonproliferative diabetic retinopathy with macular edema, bilateral: Secondary | ICD-10-CM | POA: Diagnosis not present

## 2022-06-24 DIAGNOSIS — H43813 Vitreous degeneration, bilateral: Secondary | ICD-10-CM

## 2022-06-24 DIAGNOSIS — Z794 Long term (current) use of insulin: Secondary | ICD-10-CM | POA: Diagnosis not present

## 2022-07-01 DIAGNOSIS — E119 Type 2 diabetes mellitus without complications: Secondary | ICD-10-CM | POA: Diagnosis not present

## 2022-07-29 ENCOUNTER — Encounter (INDEPENDENT_AMBULATORY_CARE_PROVIDER_SITE_OTHER): Payer: Medicare HMO | Admitting: Ophthalmology

## 2022-07-29 DIAGNOSIS — H43813 Vitreous degeneration, bilateral: Secondary | ICD-10-CM | POA: Diagnosis not present

## 2022-07-29 DIAGNOSIS — E113313 Type 2 diabetes mellitus with moderate nonproliferative diabetic retinopathy with macular edema, bilateral: Secondary | ICD-10-CM | POA: Diagnosis not present

## 2022-07-29 DIAGNOSIS — Z794 Long term (current) use of insulin: Secondary | ICD-10-CM | POA: Diagnosis not present

## 2022-07-31 DIAGNOSIS — E138 Other specified diabetes mellitus with unspecified complications: Secondary | ICD-10-CM | POA: Diagnosis not present

## 2022-07-31 DIAGNOSIS — E119 Type 2 diabetes mellitus without complications: Secondary | ICD-10-CM | POA: Diagnosis not present

## 2022-08-26 DIAGNOSIS — H35022 Exudative retinopathy, left eye: Secondary | ICD-10-CM | POA: Diagnosis not present

## 2022-08-26 DIAGNOSIS — Z961 Presence of intraocular lens: Secondary | ICD-10-CM | POA: Diagnosis not present

## 2022-08-26 DIAGNOSIS — E08311 Diabetes mellitus due to underlying condition with unspecified diabetic retinopathy with macular edema: Secondary | ICD-10-CM | POA: Diagnosis not present

## 2022-09-02 ENCOUNTER — Encounter (INDEPENDENT_AMBULATORY_CARE_PROVIDER_SITE_OTHER): Payer: Medicare HMO | Admitting: Ophthalmology

## 2022-09-02 DIAGNOSIS — Z794 Long term (current) use of insulin: Secondary | ICD-10-CM | POA: Diagnosis not present

## 2022-09-02 DIAGNOSIS — R6889 Other general symptoms and signs: Secondary | ICD-10-CM | POA: Diagnosis not present

## 2022-09-02 DIAGNOSIS — E113313 Type 2 diabetes mellitus with moderate nonproliferative diabetic retinopathy with macular edema, bilateral: Secondary | ICD-10-CM | POA: Diagnosis not present

## 2022-09-02 DIAGNOSIS — H43813 Vitreous degeneration, bilateral: Secondary | ICD-10-CM | POA: Diagnosis not present

## 2022-09-06 DIAGNOSIS — E119 Type 2 diabetes mellitus without complications: Secondary | ICD-10-CM | POA: Diagnosis not present

## 2022-10-07 ENCOUNTER — Encounter (INDEPENDENT_AMBULATORY_CARE_PROVIDER_SITE_OTHER): Payer: Medicare HMO | Admitting: Ophthalmology

## 2022-10-07 DIAGNOSIS — Z7984 Long term (current) use of oral hypoglycemic drugs: Secondary | ICD-10-CM | POA: Diagnosis not present

## 2022-10-07 DIAGNOSIS — E1165 Type 2 diabetes mellitus with hyperglycemia: Secondary | ICD-10-CM | POA: Diagnosis not present

## 2022-10-08 ENCOUNTER — Encounter (INDEPENDENT_AMBULATORY_CARE_PROVIDER_SITE_OTHER): Payer: Medicare HMO | Admitting: Ophthalmology

## 2022-10-08 DIAGNOSIS — E113313 Type 2 diabetes mellitus with moderate nonproliferative diabetic retinopathy with macular edema, bilateral: Secondary | ICD-10-CM

## 2022-10-08 DIAGNOSIS — H43813 Vitreous degeneration, bilateral: Secondary | ICD-10-CM

## 2022-10-08 DIAGNOSIS — R6889 Other general symptoms and signs: Secondary | ICD-10-CM | POA: Diagnosis not present

## 2022-10-08 DIAGNOSIS — Z794 Long term (current) use of insulin: Secondary | ICD-10-CM | POA: Diagnosis not present

## 2022-10-14 DIAGNOSIS — E119 Type 2 diabetes mellitus without complications: Secondary | ICD-10-CM | POA: Diagnosis not present

## 2022-10-14 DIAGNOSIS — E139 Other specified diabetes mellitus without complications: Secondary | ICD-10-CM | POA: Diagnosis not present

## 2022-11-11 ENCOUNTER — Encounter (INDEPENDENT_AMBULATORY_CARE_PROVIDER_SITE_OTHER): Payer: Medicare HMO | Admitting: Ophthalmology

## 2022-11-11 ENCOUNTER — Encounter (INDEPENDENT_AMBULATORY_CARE_PROVIDER_SITE_OTHER): Payer: Self-pay

## 2022-11-11 DIAGNOSIS — E113313 Type 2 diabetes mellitus with moderate nonproliferative diabetic retinopathy with macular edema, bilateral: Secondary | ICD-10-CM | POA: Diagnosis not present

## 2022-11-11 DIAGNOSIS — Z794 Long term (current) use of insulin: Secondary | ICD-10-CM

## 2022-11-11 DIAGNOSIS — R6889 Other general symptoms and signs: Secondary | ICD-10-CM | POA: Diagnosis not present

## 2022-11-11 DIAGNOSIS — H43813 Vitreous degeneration, bilateral: Secondary | ICD-10-CM | POA: Diagnosis not present

## 2022-12-13 DIAGNOSIS — E785 Hyperlipidemia, unspecified: Secondary | ICD-10-CM | POA: Diagnosis not present

## 2022-12-13 DIAGNOSIS — I6930 Unspecified sequelae of cerebral infarction: Secondary | ICD-10-CM | POA: Diagnosis not present

## 2022-12-13 DIAGNOSIS — E1165 Type 2 diabetes mellitus with hyperglycemia: Secondary | ICD-10-CM | POA: Diagnosis not present

## 2022-12-13 DIAGNOSIS — I69351 Hemiplegia and hemiparesis following cerebral infarction affecting right dominant side: Secondary | ICD-10-CM | POA: Diagnosis not present

## 2022-12-13 DIAGNOSIS — E11319 Type 2 diabetes mellitus with unspecified diabetic retinopathy without macular edema: Secondary | ICD-10-CM | POA: Diagnosis not present

## 2022-12-13 DIAGNOSIS — E890 Postprocedural hypothyroidism: Secondary | ICD-10-CM | POA: Diagnosis not present

## 2022-12-13 DIAGNOSIS — E1142 Type 2 diabetes mellitus with diabetic polyneuropathy: Secondary | ICD-10-CM | POA: Diagnosis not present

## 2022-12-13 DIAGNOSIS — Z23 Encounter for immunization: Secondary | ICD-10-CM | POA: Diagnosis not present

## 2022-12-16 ENCOUNTER — Encounter (INDEPENDENT_AMBULATORY_CARE_PROVIDER_SITE_OTHER): Payer: Medicare HMO | Admitting: Ophthalmology

## 2022-12-16 DIAGNOSIS — H43813 Vitreous degeneration, bilateral: Secondary | ICD-10-CM

## 2022-12-16 DIAGNOSIS — E113313 Type 2 diabetes mellitus with moderate nonproliferative diabetic retinopathy with macular edema, bilateral: Secondary | ICD-10-CM

## 2022-12-16 DIAGNOSIS — R6889 Other general symptoms and signs: Secondary | ICD-10-CM | POA: Diagnosis not present

## 2022-12-16 DIAGNOSIS — Z794 Long term (current) use of insulin: Secondary | ICD-10-CM

## 2023-01-02 ENCOUNTER — Other Ambulatory Visit: Payer: Medicare HMO

## 2023-01-02 DIAGNOSIS — Z0100 Encounter for examination of eyes and vision without abnormal findings: Secondary | ICD-10-CM | POA: Diagnosis not present

## 2023-01-06 ENCOUNTER — Ambulatory Visit
Admission: RE | Admit: 2023-01-06 | Discharge: 2023-01-06 | Disposition: A | Discharge status: Home / Self Care | Payer: Medicare HMO | Source: Ambulatory Visit | Attending: Internal Medicine | Admitting: Internal Medicine

## 2023-01-06 DIAGNOSIS — E2839 Other primary ovarian failure: Secondary | ICD-10-CM

## 2023-01-06 DIAGNOSIS — M8588 Other specified disorders of bone density and structure, other site: Secondary | ICD-10-CM | POA: Diagnosis not present

## 2023-01-06 DIAGNOSIS — Z1231 Encounter for screening mammogram for malignant neoplasm of breast: Secondary | ICD-10-CM

## 2023-01-06 DIAGNOSIS — N958 Other specified menopausal and perimenopausal disorders: Secondary | ICD-10-CM | POA: Diagnosis not present

## 2023-01-21 ENCOUNTER — Encounter (INDEPENDENT_AMBULATORY_CARE_PROVIDER_SITE_OTHER): Payer: Medicare HMO | Admitting: Ophthalmology

## 2023-01-21 DIAGNOSIS — E113313 Type 2 diabetes mellitus with moderate nonproliferative diabetic retinopathy with macular edema, bilateral: Secondary | ICD-10-CM | POA: Diagnosis not present

## 2023-01-21 DIAGNOSIS — H43813 Vitreous degeneration, bilateral: Secondary | ICD-10-CM | POA: Diagnosis not present

## 2023-01-21 DIAGNOSIS — R6889 Other general symptoms and signs: Secondary | ICD-10-CM | POA: Diagnosis not present

## 2023-01-21 DIAGNOSIS — Z794 Long term (current) use of insulin: Secondary | ICD-10-CM | POA: Diagnosis not present

## 2023-03-07 ENCOUNTER — Encounter (INDEPENDENT_AMBULATORY_CARE_PROVIDER_SITE_OTHER): Payer: Medicare HMO | Admitting: Ophthalmology

## 2023-03-07 DIAGNOSIS — Z794 Long term (current) use of insulin: Secondary | ICD-10-CM | POA: Diagnosis not present

## 2023-03-07 DIAGNOSIS — E113313 Type 2 diabetes mellitus with moderate nonproliferative diabetic retinopathy with macular edema, bilateral: Secondary | ICD-10-CM | POA: Diagnosis not present

## 2023-03-07 DIAGNOSIS — H43813 Vitreous degeneration, bilateral: Secondary | ICD-10-CM

## 2023-03-14 ENCOUNTER — Encounter: Payer: Self-pay | Admitting: Cardiology

## 2023-03-14 ENCOUNTER — Other Ambulatory Visit: Payer: Self-pay | Admitting: Cardiology

## 2023-03-14 ENCOUNTER — Telehealth: Payer: Self-pay | Admitting: *Deleted

## 2023-03-14 ENCOUNTER — Ambulatory Visit (INDEPENDENT_AMBULATORY_CARE_PROVIDER_SITE_OTHER): Payer: Medicare HMO

## 2023-03-14 ENCOUNTER — Ambulatory Visit: Payer: Medicare HMO | Attending: Cardiology | Admitting: Cardiology

## 2023-03-14 VITALS — BP 130/74 | HR 65 | Resp 16 | Ht 66.0 in | Wt 167.4 lb

## 2023-03-14 DIAGNOSIS — I441 Atrioventricular block, second degree: Secondary | ICD-10-CM

## 2023-03-14 DIAGNOSIS — R9431 Abnormal electrocardiogram [ECG] [EKG]: Secondary | ICD-10-CM

## 2023-03-14 DIAGNOSIS — R011 Cardiac murmur, unspecified: Secondary | ICD-10-CM | POA: Diagnosis not present

## 2023-03-14 NOTE — Progress Notes (Unsigned)
Enrolled patient for a 14 day Zio XT  monitor to be mailed to patients home  °

## 2023-03-14 NOTE — Telephone Encounter (Signed)
-----   Message from Flavia Shipper sent at 03/14/2023 10:41 AM EST ----- Regarding: RE: 14 DAY ZIO PER DR. PATWARDHAN done ----- Message ----- From: Loa Socks, LPN Sent: 05/27/270   9:25 AM EST To: Ernst Bowler; Katrina Claria Dice Subject: 14 DAY ZIO PER DR. PATWARDHAN                  14 day zio for abnormal EKG  Please enroll and let me know when you do   Thanks, Yehonatan Grandison

## 2023-03-14 NOTE — Progress Notes (Signed)
  Cardiology Office Note:  .   Date:  03/14/2023  ID:  Colleen Carroll, DOB Sep 01, 1955, MRN [id number] PCP: Lorenda Ishihara, MD  [facility] Providers Cardiologist:  Truett Mainland, MD PCP: Lorenda Ishihara, MD  Chief Complaint  Patient presents with   Establish Care   New Patient (Initial Visit)      History of Present Illness: .    Colleen Carroll is a 68 y.o. female with hypertension, hyperlipidemia, type II DM, h/o stroke.  Patient moved from [place] to [place] in 2010.  She had stroke in 2003 at [place], with no significant residual neurodeficit at this time.  She reportedly was being seen regularly by cardiologist in [place] for "leaky heart valve".  She is now retired, but stays active with regular walking and exercise or any complaints of chest pain, shortness of breath.  She is still on dual antiplatelet therapy with aspirin and Plavix.  Lipids are well-controlled.  She denies presyncope, syncope, lightheadedness  Vitals:   03/14/23 0857  BP: 130/74  Pulse: 65  Resp: 16  SpO2: 96%     ROS:  Review of Systems  Cardiovascular:  Negative for chest pain, dyspnea on exertion, leg swelling, palpitations and syncope.     Studies Reviewed: Marland Kitchen        EKG 03/14/2023: Sinus rhythm with Mobitz type 1 AV block Occasional PACs Left axis deviation Minimal voltage criteria for LVH, may be normal variant ( Cornell product ) Inferior infarct , age undetermined Anterolateral infarct , age undetermined No previous ECGs available  Independently interpreted 05/2022: Chol 162, TG 46, HDL 89, LDL 63 HbA1C 7.0% Hb 12.1    Physical Exam:   Physical Exam Vitals and nursing note reviewed.  Constitutional:      General: She is not in acute distress. Neck:     Vascular: No JVD.  Cardiovascular:     Rate and Rhythm: Normal rate and regular rhythm.     Heart sounds: Murmur heard.     High-pitched blowing holosystolic murmur is present  with a grade of 2/6 at the apex.  Pulmonary:     Effort: Pulmonary effort is normal.     Breath sounds: Normal breath sounds. No wheezing or rales.      VISIT DIAGNOSES:   ICD-10-CM   1. Abnormal EKG  R94.31 EKG 12-Lead    ECHOCARDIOGRAM COMPLETE    CANCELED: LONG TERM MONITOR (3-14 DAYS)    2. Mobitz type 1 second degree AV block  I44.1     3. Murmur  R01.1 ECHOCARDIOGRAM COMPLETE       ASSESSMENT AND PLAN: .    Colleen Carroll is a 68 y.o. female with hypertension, hyperlipidemia, type II DM, h/o stroke  Abnormal EKG: EKG suggest possible Mobitz type I AV block. She has no symptoms associated with this. Will obtain echocardiogram and 2-week ZIO monitor for better evaluation.  Murmur: Suspect mild to moderate mitral regurgitation. Will check echocardiogram.  H/o stroke: Stroke was >20 years ago.   I do not think she needs dual antiplatelet therapy at this time. Stop aspirin, continue Plavix 75 mg monotherapy. Lipids well-controlled.  Hypertension: Controlled. Given her diabetes, consider addition of ACE/ARB. I do not have baseline creatinine on her. I will defer this to PCP.   F/u in 6 months  Signed, Elder Negus, MD

## 2023-03-14 NOTE — Patient Instructions (Signed)
Medication Instructions:   STOP TAKING ASPIRIN NOW  *If you need a refill on your cardiac medications before your next appointment, please call your pharmacy*    Testing/Procedures:  Your physician has requested that you have an echocardiogram. Echocardiography is a painless test that uses sound waves to create images of your heart. It provides your doctor with information about the size and shape of your heart and how well your heart's chambers and valves are working. This procedure takes approximately one hour. There are no restrictions for this procedure. Please do NOT wear cologne, perfume, aftershave, or lotions (deodorant is allowed). Please arrive 15 minutes prior to your appointment time.  Please note: We ask at that you not bring children with you during ultrasound (echo/ vascular) testing. Due to room size and safety concerns, children are not allowed in the ultrasound rooms during exams. Our front office staff cannot provide observation of children in our lobby area while testing is being conducted. An adult accompanying a patient to their appointment will only be allowed in the ultrasound room at the discretion of the ultrasound technician under special circumstances. We apologize for any inconvenience.    ZIO XT- Long Term Monitor Instructions  Your physician has requested you wear a ZIO patch monitor for 14 days.  This is a single patch monitor. [employer] supplies one patch monitor per enrollment. Additional stickers are not available. Please do not apply patch if you will be having a Nuclear Stress Test,  Echocardiogram, Cardiac CT, MRI, or Chest Xray during the period you would be wearing the  monitor. The patch cannot be worn during these tests. You cannot remove and re-apply the  ZIO XT patch monitor.  Your ZIO patch monitor will be mailed 3 day USPS to your address on file. It may take 3-5 days  to receive your monitor after you have been enrolled.  Once you have received  your monitor, please review the enclosed instructions. Your monitor  has already been registered assigning a specific monitor serial # to you.  Billing and Patient Assistance Program Information  We have supplied [employer] with any of your insurance information on file for billing purposes. [employer] offers a sliding scale Patient Assistance Program for patients that do not have  insurance, or whose insurance does not completely cover the cost of the ZIO monitor.  You must apply for the Patient Assistance Program to qualify for this discounted rate.  To apply, please call [employer] at [phone], select option 4, select option 2, ask to apply for  Patient Assistance Program. Meredeth Ide will ask your household income, and how many people  are in your household. They will quote your out-of-pocket cost based on that information.  [employer] will also be able to set up a 28-month, interest-free payment plan if needed.  Applying the monitor   Shave hair from upper left chest.  Hold abrader disc by orange tab. Rub abrader in 40 strokes over the upper left chest as  indicated in your monitor instructions.  Clean area with 4 enclosed alcohol pads. Let dry.  Apply patch as indicated in monitor instructions. Patch will be placed under collarbone on left  side of chest with arrow pointing upward.  Rub patch adhesive wings for 2 minutes. Remove white label marked "1". Remove the white  label marked "2". Rub patch adhesive wings for 2 additional minutes.  While looking in a mirror, press and release button in center of patch. A small green light will  flash 3-4 times.  This will be your only indicator that the monitor has been turned on.  Do not shower for the first 24 hours. You may shower after the first 24 hours.  Press the button if you feel a symptom. You will hear a small click. Record Date, Time and  Symptom in the Patient Logbook.  When you are ready to remove the patch, follow instructions on the last  2 pages of Patient  Logbook. Stick patch monitor onto the last page of Patient Logbook.  Place Patient Logbook in the blue and white box. Use locking tab on box and tape box closed  securely. The blue and white box has prepaid postage on it. Please place it in the mailbox as  soon as possible. Your physician should have your test results approximately 7 days after the  monitor has been mailed back to [facility].  Call [facility] Customer Care at [phone] if you have questions regarding  your ZIO XT patch monitor. Call them immediately if you see an orange light blinking on your  monitor.  If your monitor falls off in less than 4 days, contact our Monitor department at [phone].  If your monitor becomes loose or falls off after 4 days call [employer] at [phone] for  suggestions on securing your monitor    Follow-Up: At [facility], you and your health needs are our priority.  As part of our continuing mission to provide you with exceptional heart care, we have created designated Provider Care Teams.  These Care Teams include your primary Cardiologist (physician) and Advanced Practice Providers (APPs -  Physician Assistants and Nurse Practitioners) who all work together to provide you with the care you need, when you need it.  We recommend signing up for the patient portal called "MyChart".  Sign up information is provided on this After Visit Summary.  MyChart is used to connect with patients for Virtual Visits (Telemedicine).  Patients are able to view lab/test results, encounter notes, upcoming appointments, etc.  Non-urgent messages can be sent to your provider as well.   To learn more about what you can do with MyChart, go to [email or web address].    Your next appointment:   6 month(s)  Provider:   DR. Rosemary Holms

## 2023-03-20 DIAGNOSIS — R9431 Abnormal electrocardiogram [ECG] [EKG]: Secondary | ICD-10-CM

## 2023-03-20 DIAGNOSIS — I441 Atrioventricular block, second degree: Secondary | ICD-10-CM

## 2023-04-04 ENCOUNTER — Ambulatory Visit (HOSPITAL_COMMUNITY): Payer: Medicare HMO | Attending: Cardiology

## 2023-04-04 ENCOUNTER — Encounter: Payer: Self-pay | Admitting: Cardiology

## 2023-04-04 DIAGNOSIS — R9431 Abnormal electrocardiogram [ECG] [EKG]: Secondary | ICD-10-CM | POA: Insufficient documentation

## 2023-04-04 DIAGNOSIS — R011 Cardiac murmur, unspecified: Secondary | ICD-10-CM | POA: Insufficient documentation

## 2023-04-04 LAB — ECHOCARDIOGRAM COMPLETE
Area-P 1/2: 4.13 cm2
S' Lateral: 2.3 cm

## 2023-04-09 DIAGNOSIS — R9431 Abnormal electrocardiogram [ECG] [EKG]: Secondary | ICD-10-CM | POA: Diagnosis not present

## 2023-04-09 DIAGNOSIS — I441 Atrioventricular block, second degree: Secondary | ICD-10-CM | POA: Diagnosis not present

## 2023-04-10 NOTE — Progress Notes (Signed)
No significant heart rhythm abnormalities noted.   Regards, Dr. Rosemary Holms

## 2023-04-11 ENCOUNTER — Encounter (INDEPENDENT_AMBULATORY_CARE_PROVIDER_SITE_OTHER): Payer: Medicare HMO | Admitting: Ophthalmology

## 2023-04-11 DIAGNOSIS — E113313 Type 2 diabetes mellitus with moderate nonproliferative diabetic retinopathy with macular edema, bilateral: Secondary | ICD-10-CM | POA: Diagnosis not present

## 2023-04-11 DIAGNOSIS — Z794 Long term (current) use of insulin: Secondary | ICD-10-CM

## 2023-04-11 DIAGNOSIS — H43813 Vitreous degeneration, bilateral: Secondary | ICD-10-CM

## 2023-04-18 ENCOUNTER — Encounter: Payer: Self-pay | Admitting: *Deleted

## 2023-05-13 ENCOUNTER — Encounter (INDEPENDENT_AMBULATORY_CARE_PROVIDER_SITE_OTHER): Payer: Medicare HMO | Admitting: Ophthalmology

## 2023-05-13 DIAGNOSIS — H43813 Vitreous degeneration, bilateral: Secondary | ICD-10-CM

## 2023-05-13 DIAGNOSIS — Z794 Long term (current) use of insulin: Secondary | ICD-10-CM

## 2023-05-13 DIAGNOSIS — E113313 Type 2 diabetes mellitus with moderate nonproliferative diabetic retinopathy with macular edema, bilateral: Secondary | ICD-10-CM

## 2023-05-19 DIAGNOSIS — E119 Type 2 diabetes mellitus without complications: Secondary | ICD-10-CM | POA: Diagnosis not present

## 2023-05-19 DIAGNOSIS — Z7984 Long term (current) use of oral hypoglycemic drugs: Secondary | ICD-10-CM | POA: Diagnosis not present

## 2023-05-19 DIAGNOSIS — E1165 Type 2 diabetes mellitus with hyperglycemia: Secondary | ICD-10-CM | POA: Diagnosis not present

## 2023-06-17 ENCOUNTER — Encounter (INDEPENDENT_AMBULATORY_CARE_PROVIDER_SITE_OTHER): Admitting: Ophthalmology

## 2023-06-17 DIAGNOSIS — E785 Hyperlipidemia, unspecified: Secondary | ICD-10-CM | POA: Diagnosis not present

## 2023-06-17 DIAGNOSIS — I69351 Hemiplegia and hemiparesis following cerebral infarction affecting right dominant side: Secondary | ICD-10-CM | POA: Diagnosis not present

## 2023-06-17 DIAGNOSIS — M5431 Sciatica, right side: Secondary | ICD-10-CM | POA: Diagnosis not present

## 2023-06-17 DIAGNOSIS — D509 Iron deficiency anemia, unspecified: Secondary | ICD-10-CM | POA: Diagnosis not present

## 2023-06-17 DIAGNOSIS — E1142 Type 2 diabetes mellitus with diabetic polyneuropathy: Secondary | ICD-10-CM | POA: Diagnosis not present

## 2023-06-17 DIAGNOSIS — Z0000 Encounter for general adult medical examination without abnormal findings: Secondary | ICD-10-CM | POA: Diagnosis not present

## 2023-06-17 DIAGNOSIS — E113313 Type 2 diabetes mellitus with moderate nonproliferative diabetic retinopathy with macular edema, bilateral: Secondary | ICD-10-CM | POA: Diagnosis not present

## 2023-06-17 DIAGNOSIS — E890 Postprocedural hypothyroidism: Secondary | ICD-10-CM | POA: Diagnosis not present

## 2023-06-17 DIAGNOSIS — Z23 Encounter for immunization: Secondary | ICD-10-CM | POA: Diagnosis not present

## 2023-06-17 DIAGNOSIS — I6930 Unspecified sequelae of cerebral infarction: Secondary | ICD-10-CM | POA: Diagnosis not present

## 2023-06-17 DIAGNOSIS — Z1331 Encounter for screening for depression: Secondary | ICD-10-CM | POA: Diagnosis not present

## 2023-06-18 ENCOUNTER — Encounter (INDEPENDENT_AMBULATORY_CARE_PROVIDER_SITE_OTHER): Admitting: Ophthalmology

## 2023-06-18 DIAGNOSIS — E113313 Type 2 diabetes mellitus with moderate nonproliferative diabetic retinopathy with macular edema, bilateral: Secondary | ICD-10-CM | POA: Diagnosis not present

## 2023-06-18 DIAGNOSIS — H43813 Vitreous degeneration, bilateral: Secondary | ICD-10-CM | POA: Diagnosis not present

## 2023-06-18 DIAGNOSIS — Z794 Long term (current) use of insulin: Secondary | ICD-10-CM

## 2023-07-08 DIAGNOSIS — K219 Gastro-esophageal reflux disease without esophagitis: Secondary | ICD-10-CM | POA: Diagnosis not present

## 2023-07-08 DIAGNOSIS — R1310 Dysphagia, unspecified: Secondary | ICD-10-CM | POA: Diagnosis not present

## 2023-07-15 DIAGNOSIS — E119 Type 2 diabetes mellitus without complications: Secondary | ICD-10-CM | POA: Diagnosis not present

## 2023-07-23 ENCOUNTER — Encounter (INDEPENDENT_AMBULATORY_CARE_PROVIDER_SITE_OTHER): Admitting: Ophthalmology

## 2023-07-23 DIAGNOSIS — H43813 Vitreous degeneration, bilateral: Secondary | ICD-10-CM | POA: Diagnosis not present

## 2023-07-23 DIAGNOSIS — Z794 Long term (current) use of insulin: Secondary | ICD-10-CM

## 2023-07-23 DIAGNOSIS — E113313 Type 2 diabetes mellitus with moderate nonproliferative diabetic retinopathy with macular edema, bilateral: Secondary | ICD-10-CM | POA: Diagnosis not present

## 2023-07-24 DIAGNOSIS — K31A19 Gastric intestinal metaplasia without dysplasia, unspecified site: Secondary | ICD-10-CM | POA: Diagnosis not present

## 2023-07-24 DIAGNOSIS — K2930 Chronic superficial gastritis without bleeding: Secondary | ICD-10-CM | POA: Diagnosis not present

## 2023-07-24 DIAGNOSIS — R1310 Dysphagia, unspecified: Secondary | ICD-10-CM | POA: Diagnosis not present

## 2023-07-24 DIAGNOSIS — K224 Dyskinesia of esophagus: Secondary | ICD-10-CM | POA: Diagnosis not present

## 2023-07-24 DIAGNOSIS — K2940 Chronic atrophic gastritis without bleeding: Secondary | ICD-10-CM | POA: Diagnosis not present

## 2023-07-24 DIAGNOSIS — B9681 Helicobacter pylori [H. pylori] as the cause of diseases classified elsewhere: Secondary | ICD-10-CM | POA: Diagnosis not present

## 2023-07-31 DIAGNOSIS — B9681 Helicobacter pylori [H. pylori] as the cause of diseases classified elsewhere: Secondary | ICD-10-CM | POA: Diagnosis not present

## 2023-07-31 DIAGNOSIS — K31A19 Gastric intestinal metaplasia without dysplasia, unspecified site: Secondary | ICD-10-CM | POA: Diagnosis not present

## 2023-07-31 DIAGNOSIS — K2940 Chronic atrophic gastritis without bleeding: Secondary | ICD-10-CM | POA: Diagnosis not present

## 2023-08-21 ENCOUNTER — Encounter: Payer: Self-pay | Admitting: Cardiology

## 2023-08-21 ENCOUNTER — Ambulatory Visit: Payer: Medicare HMO | Attending: Cardiology | Admitting: Cardiology

## 2023-08-21 VITALS — BP 125/71 | HR 74 | Ht 66.0 in | Wt 161.0 lb

## 2023-08-21 DIAGNOSIS — E782 Mixed hyperlipidemia: Secondary | ICD-10-CM | POA: Insufficient documentation

## 2023-08-21 DIAGNOSIS — M79605 Pain in left leg: Secondary | ICD-10-CM | POA: Diagnosis not present

## 2023-08-21 DIAGNOSIS — E119 Type 2 diabetes mellitus without complications: Secondary | ICD-10-CM | POA: Diagnosis not present

## 2023-08-21 NOTE — Patient Instructions (Addendum)
 Medication Instructions:   Your physician recommends that you continue on your current medications as directed. Please refer to the Current Medication list given to you today.  *If you need a refill on your cardiac medications before your next appointment, please call your pharmacy*    Testing/Procedures:  Your physician has requested that you have an ankle brachial index (ABI). During this test an ultrasound and blood pressure cuff are used to evaluate the arteries that supply the arms and legs with blood. Allow thirty minutes for this exam. There are no restrictions or special instructions.  Please note: We ask at that you not bring children with you during ultrasound (echo/ vascular) testing. Due to room size and safety concerns, children are not allowed in the ultrasound rooms during exams. Our front office staff cannot provide observation of children in our lobby area while testing is being conducted. An adult accompanying a patient to their appointment will only be allowed in the ultrasound room at the discretion of the ultrasound technician under special circumstances. We apologize for any inconvenience.   Your physician has requested that you have a LEFT lower extremity venous duplex. This test is an ultrasound of the veins in the legs or arms. It looks at venous blood flow that carries blood from the heart to the legs or arms. Allow one hour for a Lower Venous exam. Allow thirty minutes for an Upper Venous exam. There are no restrictions or special instructions.  Please note: We ask at that you not bring children with you during ultrasound (echo/ vascular) testing. Due to room size and safety concerns, children are not allowed in the ultrasound rooms during exams. Our front office staff cannot provide observation of children in our lobby area while testing is being conducted. An adult accompanying a patient to their appointment will only be allowed in the ultrasound room at the discretion  of the ultrasound technician under special circumstances. We apologize for any inconvenience.   Follow-Up:  AS NEEDED WITH DR. PATWARDHAN

## 2023-08-21 NOTE — Progress Notes (Signed)
 Cardiology Office Note:  .   Date:  08/21/2023  ID:  Colleen Carroll, DOB [birth year], MRN [id number] PCP: Elliot Charm, MD  [facility] Providers Cardiologist:  Newman Lawrence, MD PCP: Elliot Charm, MD  Chief Complaint  Patient presents with   Left leg pain      History of Present Illness: .    Colleen Carroll is a 68 y.o. female with hypertension, hyperlipidemia, type II DM, h/o stroke.  Patient moved from [name]  to [name] in 2010.  She had stroke in 2003 at [name] , with no significant residual neurodeficit at this time.  She reportedly was being seen regularly by cardiologist in [name]  for leaky heart valve.  She is now retired, but stays active with regular walking and exercise or any complaints of chest pain, shortness of breath.  She is still on dual antiplatelet therapy with aspirin and Plavix.  Lipids are well-controlled.  She denies presyncope, syncope, lightheadedness  No new symptoms or complaints since her last visit.  Reviewed echocardiogram results with the patient, details below.  She has had pain in her left thigh, unrelated to exertion.  It appears that she underwent GI workup with endoscopy and was recommended 2 weeks of antibiotics and certain other medications.  She denies any melena or hematochezia.  I do not have GI records from [place].  Vitals:   08/21/23 0822  BP: 125/71  Pulse: 74  SpO2: 99%      ROS:  Review of Systems  Cardiovascular:  Negative for chest pain, dyspnea on exertion, leg swelling, palpitations and syncope.  Musculoskeletal:        Left thigh pain     Studies Reviewed: SABRA        EKG 03/14/2023: Sinus rhythm with Mobitz type 1 AV block Occasional PACs Left axis deviation Minimal voltage criteria for LVH, may be normal variant ( Cornell product ) Inferior infarct , age undetermined Anterolateral infarct , age undetermined No previous ECGs available  Echocardiogram 03/2023:  1.  Left ventricular ejection fraction, by estimation, is 60 to 65%. The  left ventricle has normal function. The left ventricle has no regional  wall motion abnormalities. There is mild concentric left ventricular  hypertrophy. Left ventricular diastolic  parameters are consistent with Grade I diastolic dysfunction (impaired  relaxation).   2. Right ventricular systolic function is normal. The right ventricular  size is normal.   3. The mitral valve is normal in structure. Trivial mitral valve  regurgitation. No evidence of mitral stenosis.   4. The aortic valve is normal in structure. Aortic valve regurgitation is  not visualized. No aortic stenosis is present.   5. The inferior vena cava is normal in size with greater than 50%  respiratory variability, suggesting right atrial pressure of 3 mmHg.   Zio patch monitor 13 days 03/20/2023 - 04/02/2023: Dominant rhythm: Sinus. HR 49-139 bpm. Avg HR 82 bpm, in sinus rhythm. 1 episodes of atrial tachycardia at 141 bpm for 5 beats. <1% isolated SVE, couplet/triplets. 0 episodes of VT. <1% isolated VE, couplet/triplets. Second degree type 1 AV block noted. No atrial fibrillation/atrial flutter/VT/high grade AV block, sinus pause >3sec noted. 0 patient triggered events.    Independently interpreted 05/2022: Chol 162, TG 46, HDL 89, LDL 63 HbA1C 7.0% Hb 12.1    Physical Exam:   Physical Exam Vitals and nursing note reviewed.  Constitutional:      General: She is not in acute distress. Neck:  Vascular: No JVD.   Cardiovascular:     Rate and Rhythm: Normal rate and regular rhythm.     Pulses: Normal pulses.     Heart sounds: Murmur heard.     High-pitched blowing holosystolic murmur is present with a grade of 2/6 at the apex.  Pulmonary:     Effort: Pulmonary effort is normal.     Breath sounds: Normal breath sounds. No wheezing or rales.   Musculoskeletal:     Right lower leg: No edema.     Left lower leg: No edema.       VISIT DIAGNOSES:   ICD-10-CM   1. Left leg pain  M79.605 VAS US  ABI WITH/WO TBI    VAS US  LOWER EXTREMITY VENOUS (DVT)    2. Type 2 diabetes mellitus without complication, without long-term current use of insulin (HCC)  E11.9 VAS US  ABI WITH/WO TBI    VAS US  LOWER EXTREMITY VENOUS (DVT)    3. Mixed hyperlipidemia  E78.2         ASSESSMENT AND PLAN: .    Colleen Carroll is a 68 y.o. female with hypertension, hyperlipidemia, type II DM, h/o stroke  Abnormal EKG: Initial EKG suggest possible Mobitz type I AV block. She has no symptoms associated with this. 2-week ZIO monitor was reassuring without any significant arrhythmia.  Murmur: Only trivial mitral regurgitation seen on echocardiogram 03/2023.  Grade 1 diastolic function, but otherwise unremarkable echocardiogram.  H/o stroke: Stroke was >20 years ago.   I do not think she needs dual antiplatelet therapy at this time. Continue Plavix 75 mg monotherapy. Lipids well-controlled. It appears that she has had some recent EGD workup, but denies any melena or hematochezia.  As long as there is no bleeding concerns, I would continue Plavix 75 mg daily.  Hypertension: Controlled. Given her diabetes, consider addition of ACE/ARB. I will defer this to PCP.  Left leg pain: Physical exam fairly unremarkable with intact distal pulses.  Given her diabetes, I will obtain bilateral ABI, as well as left leg DVT study.    Continue follow-up with PCP Dr. Varadarajan. F/u as needed  Signed, Newman JINNY Lawrence, MD

## 2023-08-27 ENCOUNTER — Encounter (INDEPENDENT_AMBULATORY_CARE_PROVIDER_SITE_OTHER): Admitting: Ophthalmology

## 2023-08-27 DIAGNOSIS — H43813 Vitreous degeneration, bilateral: Secondary | ICD-10-CM

## 2023-08-27 DIAGNOSIS — Z794 Long term (current) use of insulin: Secondary | ICD-10-CM

## 2023-08-27 DIAGNOSIS — E113313 Type 2 diabetes mellitus with moderate nonproliferative diabetic retinopathy with macular edema, bilateral: Secondary | ICD-10-CM

## 2023-09-10 ENCOUNTER — Ambulatory Visit (HOSPITAL_BASED_OUTPATIENT_CLINIC_OR_DEPARTMENT_OTHER)
Admission: RE | Admit: 2023-09-10 | Discharge: 2023-09-10 | Disposition: A | Discharge status: Home / Self Care | Source: Ambulatory Visit | Attending: Cardiology | Admitting: Cardiology

## 2023-09-10 ENCOUNTER — Ambulatory Visit (HOSPITAL_COMMUNITY)
Admission: RE | Admit: 2023-09-10 | Discharge: 2023-09-10 | Disposition: A | Discharge status: Home / Self Care | Source: Ambulatory Visit | Attending: Cardiology | Admitting: Cardiology

## 2023-09-10 DIAGNOSIS — M79605 Pain in left leg: Secondary | ICD-10-CM | POA: Diagnosis not present

## 2023-09-10 DIAGNOSIS — E119 Type 2 diabetes mellitus without complications: Secondary | ICD-10-CM | POA: Diagnosis not present

## 2023-09-10 LAB — VAS US ABI WITH/WO TBI
Left ABI: 1.22
Right ABI: 1.2

## 2023-09-13 ENCOUNTER — Ambulatory Visit: Payer: Self-pay | Admitting: Cardiology

## 2023-10-01 ENCOUNTER — Encounter (INDEPENDENT_AMBULATORY_CARE_PROVIDER_SITE_OTHER): Admitting: Ophthalmology

## 2023-10-01 DIAGNOSIS — Z794 Long term (current) use of insulin: Secondary | ICD-10-CM | POA: Diagnosis not present

## 2023-10-01 DIAGNOSIS — E113313 Type 2 diabetes mellitus with moderate nonproliferative diabetic retinopathy with macular edema, bilateral: Secondary | ICD-10-CM | POA: Diagnosis not present

## 2023-10-01 DIAGNOSIS — H43813 Vitreous degeneration, bilateral: Secondary | ICD-10-CM

## 2023-11-05 ENCOUNTER — Encounter (INDEPENDENT_AMBULATORY_CARE_PROVIDER_SITE_OTHER): Admitting: Ophthalmology

## 2023-11-05 DIAGNOSIS — E113313 Type 2 diabetes mellitus with moderate nonproliferative diabetic retinopathy with macular edema, bilateral: Secondary | ICD-10-CM

## 2023-11-05 DIAGNOSIS — H43813 Vitreous degeneration, bilateral: Secondary | ICD-10-CM | POA: Diagnosis not present

## 2023-11-05 DIAGNOSIS — Z794 Long term (current) use of insulin: Secondary | ICD-10-CM | POA: Diagnosis not present

## 2023-11-19 DIAGNOSIS — E1165 Type 2 diabetes mellitus with hyperglycemia: Secondary | ICD-10-CM | POA: Diagnosis not present

## 2023-11-21 DIAGNOSIS — E119 Type 2 diabetes mellitus without complications: Secondary | ICD-10-CM | POA: Diagnosis not present

## 2023-11-25 DIAGNOSIS — H6123 Impacted cerumen, bilateral: Secondary | ICD-10-CM | POA: Diagnosis not present

## 2023-12-09 ENCOUNTER — Encounter (INDEPENDENT_AMBULATORY_CARE_PROVIDER_SITE_OTHER): Admitting: Ophthalmology

## 2023-12-09 DIAGNOSIS — Z794 Long term (current) use of insulin: Secondary | ICD-10-CM

## 2023-12-09 DIAGNOSIS — H43813 Vitreous degeneration, bilateral: Secondary | ICD-10-CM

## 2023-12-09 DIAGNOSIS — E113313 Type 2 diabetes mellitus with moderate nonproliferative diabetic retinopathy with macular edema, bilateral: Secondary | ICD-10-CM | POA: Diagnosis not present

## 2023-12-10 DIAGNOSIS — H6121 Impacted cerumen, right ear: Secondary | ICD-10-CM | POA: Diagnosis not present

## 2023-12-12 ENCOUNTER — Other Ambulatory Visit: Payer: Self-pay | Admitting: Internal Medicine

## 2023-12-12 DIAGNOSIS — Z1231 Encounter for screening mammogram for malignant neoplasm of breast: Secondary | ICD-10-CM

## 2023-12-17 DIAGNOSIS — Z23 Encounter for immunization: Secondary | ICD-10-CM | POA: Diagnosis not present

## 2023-12-17 DIAGNOSIS — E785 Hyperlipidemia, unspecified: Secondary | ICD-10-CM | POA: Diagnosis not present

## 2023-12-17 DIAGNOSIS — E1142 Type 2 diabetes mellitus with diabetic polyneuropathy: Secondary | ICD-10-CM | POA: Diagnosis not present

## 2023-12-17 DIAGNOSIS — R030 Elevated blood-pressure reading, without diagnosis of hypertension: Secondary | ICD-10-CM | POA: Diagnosis not present

## 2023-12-22 DIAGNOSIS — E138 Other specified diabetes mellitus with unspecified complications: Secondary | ICD-10-CM | POA: Diagnosis not present

## 2024-01-13 ENCOUNTER — Encounter (INDEPENDENT_AMBULATORY_CARE_PROVIDER_SITE_OTHER): Admitting: Ophthalmology

## 2024-01-13 DIAGNOSIS — E113313 Type 2 diabetes mellitus with moderate nonproliferative diabetic retinopathy with macular edema, bilateral: Secondary | ICD-10-CM | POA: Diagnosis not present

## 2024-01-13 DIAGNOSIS — Z794 Long term (current) use of insulin: Secondary | ICD-10-CM | POA: Diagnosis not present

## 2024-01-13 DIAGNOSIS — H43813 Vitreous degeneration, bilateral: Secondary | ICD-10-CM

## 2024-01-14 ENCOUNTER — Ambulatory Visit
Admission: RE | Admit: 2024-01-14 | Discharge: 2024-01-14 | Disposition: A | Source: Ambulatory Visit | Attending: Internal Medicine | Admitting: Internal Medicine

## 2024-01-14 DIAGNOSIS — Z1231 Encounter for screening mammogram for malignant neoplasm of breast: Secondary | ICD-10-CM

## 2024-02-13 ENCOUNTER — Encounter (INDEPENDENT_AMBULATORY_CARE_PROVIDER_SITE_OTHER): Admitting: Ophthalmology

## 2024-02-13 DIAGNOSIS — E113313 Type 2 diabetes mellitus with moderate nonproliferative diabetic retinopathy with macular edema, bilateral: Secondary | ICD-10-CM

## 2024-02-13 DIAGNOSIS — Z794 Long term (current) use of insulin: Secondary | ICD-10-CM | POA: Diagnosis not present

## 2024-02-13 DIAGNOSIS — H43813 Vitreous degeneration, bilateral: Secondary | ICD-10-CM | POA: Diagnosis not present

## 2024-02-17 ENCOUNTER — Encounter (INDEPENDENT_AMBULATORY_CARE_PROVIDER_SITE_OTHER): Admitting: Ophthalmology

## 2024-03-15 ENCOUNTER — Encounter (INDEPENDENT_AMBULATORY_CARE_PROVIDER_SITE_OTHER): Admitting: Ophthalmology

## 2024-03-15 DIAGNOSIS — Z794 Long term (current) use of insulin: Secondary | ICD-10-CM | POA: Diagnosis not present

## 2024-03-15 DIAGNOSIS — H43813 Vitreous degeneration, bilateral: Secondary | ICD-10-CM | POA: Diagnosis not present

## 2024-03-15 DIAGNOSIS — E113313 Type 2 diabetes mellitus with moderate nonproliferative diabetic retinopathy with macular edema, bilateral: Secondary | ICD-10-CM | POA: Diagnosis not present

## 2024-03-31 ENCOUNTER — Ambulatory Visit (INDEPENDENT_AMBULATORY_CARE_PROVIDER_SITE_OTHER)

## 2024-03-31 ENCOUNTER — Ambulatory Visit: Admitting: Podiatry

## 2024-03-31 ENCOUNTER — Encounter: Payer: Self-pay | Admitting: Podiatry

## 2024-03-31 DIAGNOSIS — S8262XA Displaced fracture of lateral malleolus of left fibula, initial encounter for closed fracture: Secondary | ICD-10-CM

## 2024-03-31 DIAGNOSIS — S9032XA Contusion of left foot, initial encounter: Secondary | ICD-10-CM

## 2024-03-31 DIAGNOSIS — R600 Localized edema: Secondary | ICD-10-CM

## 2024-03-31 NOTE — Progress Notes (Signed)
 Subjective:   Patient ID: Colleen Carroll, female   DOB: 69 y.o.   MRN: [id number]   HPI Patient states that she slipped on January 31 in her driveway and has developed severe pain in the left ankle and felt like something cracked.  Patient presents today with caregiver stating it is been very sore.  Patient has a lot of swelling patient does not smoke tries to be active   Review of Systems  All other systems reviewed and are negative.       Objective:  Physical Exam Vitals and nursing note reviewed.  Constitutional:      Appearance: She is well-developed.  Pulmonary:     Effort: Pulmonary effort is normal.  Musculoskeletal:        General: Normal range of motion.  Skin:    General: Skin is warm.  Neurological:     Mental Status: She is alert.     Neurovascular status intact with patient found to have significant reduction range of motion left with splinting reduced muscle strength related to the swelling and edema she is experiencing.  She has a lot of swelling and edema going from her midfoot ankle and severe pain when I press the lateral fibula with no instability noted currently.  Patient does have good digital perfusion well oriented     Assessment:  Probability for fracture of the fibula left cannot rule out diastases injury or instability of the ankle joint     Plan:  H&P multiple x-rays taken does have a fracture of the fibula left with moderate separation with the ankle joint appearing okay at this time.  I went ahead today due to the swelling and I did do an Unna boot Ace wrap to push out some of the swelling advised on elevation and due to the fracture and discomfort I did dispense air fracture walker properly fitted to her lower leg that I want her to wear full-time.  She will be evaluated next week and decision could be made on the possibility for surgery for this condition.  If anything were to occur prior she is to reappoint immediately but it hopefully will  have reduced edema by the time she is seen next week

## 2024-04-09 ENCOUNTER — Ambulatory Visit

## 2024-04-12 ENCOUNTER — Encounter (INDEPENDENT_AMBULATORY_CARE_PROVIDER_SITE_OTHER): Admitting: Ophthalmology
